# Patient Record
Sex: Female | Born: 1990 | Race: White | Hispanic: No | Marital: Married | Smoking: Never smoker
Health system: Southern US, Community
[De-identification: ages and names within clinical notes are randomized; demographics above are authoritative.]

## PROBLEM LIST (undated history)

## (undated) ENCOUNTER — Inpatient Hospital Stay (HOSPITAL_COMMUNITY): Payer: Self-pay

## (undated) DIAGNOSIS — O139 Gestational [pregnancy-induced] hypertension without significant proteinuria, unspecified trimester: Secondary | ICD-10-CM

## (undated) DIAGNOSIS — F419 Anxiety disorder, unspecified: Secondary | ICD-10-CM

---

## 2015-10-15 ENCOUNTER — Inpatient Hospital Stay (HOSPITAL_COMMUNITY)

## 2015-10-15 ENCOUNTER — Inpatient Hospital Stay (HOSPITAL_COMMUNITY)
Admission: AD | Admit: 2015-10-15 | Discharge: 2015-10-15 | Disposition: A | Discharge status: Home / Self Care | Source: Ambulatory Visit | Attending: Family Medicine | Admitting: Family Medicine

## 2015-10-15 ENCOUNTER — Encounter (HOSPITAL_COMMUNITY): Payer: Self-pay | Admitting: *Deleted

## 2015-10-15 DIAGNOSIS — R109 Unspecified abdominal pain: Secondary | ICD-10-CM | POA: Diagnosis not present

## 2015-10-15 DIAGNOSIS — O9989 Other specified diseases and conditions complicating pregnancy, childbirth and the puerperium: Secondary | ICD-10-CM | POA: Insufficient documentation

## 2015-10-15 DIAGNOSIS — Z3A01 Less than 8 weeks gestation of pregnancy: Secondary | ICD-10-CM | POA: Insufficient documentation

## 2015-10-15 DIAGNOSIS — O26899 Other specified pregnancy related conditions, unspecified trimester: Secondary | ICD-10-CM

## 2015-10-15 HISTORY — DX: Anxiety disorder, unspecified: F41.9

## 2015-10-15 LAB — CBC WITH DIFFERENTIAL/PLATELET
BASOS PCT: 0 %
Basophils Absolute: 0 10*3/uL (ref 0.0–0.1)
EOS ABS: 0.1 10*3/uL (ref 0.0–0.7)
Eosinophils Relative: 1 %
HEMATOCRIT: 38.9 % (ref 36.0–46.0)
HEMOGLOBIN: 13.5 g/dL (ref 12.0–15.0)
LYMPHS ABS: 2.3 10*3/uL (ref 0.7–4.0)
Lymphocytes Relative: 19 %
MCH: 30.5 pg (ref 26.0–34.0)
MCHC: 34.7 g/dL (ref 30.0–36.0)
MCV: 87.8 fL (ref 78.0–100.0)
MONOS PCT: 4 %
Monocytes Absolute: 0.5 10*3/uL (ref 0.1–1.0)
Neutro Abs: 9.3 10*3/uL — ABNORMAL HIGH (ref 1.7–7.7)
Neutrophils Relative %: 76 %
Platelets: 353 10*3/uL (ref 150–400)
RBC: 4.43 MIL/uL (ref 3.87–5.11)
RDW: 14.1 % (ref 11.5–15.5)
WBC: 12.2 10*3/uL — ABNORMAL HIGH (ref 4.0–10.5)

## 2015-10-15 LAB — GLUCOSE, CAPILLARY: GLUCOSE-CAPILLARY: 88 mg/dL (ref 65–99)

## 2015-10-15 LAB — HCG, QUANTITATIVE, PREGNANCY: HCG, BETA CHAIN, QUANT, S: 3883 m[IU]/mL — AB (ref <5)

## 2015-10-15 LAB — POCT PREGNANCY, URINE: Preg Test, Ur: POSITIVE — AB

## 2015-10-15 MED ORDER — ONDANSETRON 8 MG PO TBDP
8.0000 mg | ORAL_TABLET | Freq: Once | ORAL | Status: AC
  Administered 2015-10-15: 8 mg via ORAL
  Filled 2015-10-15: qty 1

## 2015-10-15 NOTE — MAU Note (Signed)
Pt states has been having stabbing pain in lower stomach since 1500 today and felt like she was going to pass out.  LMP 09/10/15.  Denies vaginal bleeding/discharge.

## 2015-10-15 NOTE — MAU Provider Note (Signed)
History     g3P2  @ apx 5 wks in with severe abd pain and cramping that was so severe she almost passed out. This all started this afternoon. Denies vag bleeding.  CSN: [id number]  Arrival date & time 10/15/15  1721   First Provider Initiated Contact with Patient 10/15/15 1738      Chief Complaint  Patient presents with  . Abdominal Pain    HPI  No past medical history on file.  No past surgical history on file.  No family history on file.  Social History  Substance Use Topics  . Smoking status: Not on file  . Smokeless tobacco: Not on file  . Alcohol Use: Not on file    OB History    Gravida Para Term Preterm AB TAB SAB Ectopic Multiple Living   1               Review of Systems  Constitutional: Negative.   HENT: Negative.   Eyes: Negative.   Respiratory: Negative.   Cardiovascular: Negative.   Gastrointestinal: Positive for abdominal pain.  Endocrine: Negative.   Genitourinary: Negative.   Musculoskeletal: Negative.   Skin: Negative.   Allergic/Immunologic: Negative.   Neurological: Negative.   Hematological: Negative.   Psychiatric/Behavioral: Negative.     Allergies  Review of patient's allergies indicates not on file.  Home Medications  No current outpatient prescriptions on file.  BP 106/60 mmHg  Pulse 91  Temp(Src) 99 F (37.2 C) (Oral)  Resp 18  Ht 5\' 2"  (1.575 m)  Wt 109 lb (49.442 kg)  BMI 19.93 kg/m2  LMP 09/10/2015  Physical Exam  Constitutional: She is oriented to person, place, and time. She appears well-developed and well-nourished.  HENT:  Head: Normocephalic.  Eyes: Pupils are equal, round, and reactive to light.  Neck: Normal range of motion.  Cardiovascular: Normal rate, regular rhythm, normal heart sounds and intact distal pulses.   Pulmonary/Chest: Effort normal and breath sounds normal.  Abdominal: Soft. Bowel sounds are normal.  Genitourinary: Vagina normal and uterus normal.  Musculoskeletal: Normal range of motion.   Neurological: She is alert and oriented to person, place, and time. She has normal reflexes.  Skin: Skin is warm and dry.  Psychiatric: She has a normal mood and affect. Her behavior is normal. Judgment and thought content normal.    MAU Course  Procedures (including critical care time)  Labs Reviewed  CBC WITH DIFFERENTIAL/PLATELET  HCG, QUANTITATIVE, PREGNANCY  GLUCOSE, CAPILLARY   No results found.   1. Abdominal pain affecting pregnancy, antepartum       MDM  Cbc, quant hcg, complete us. Normal findings will d/c home

## 2015-10-15 NOTE — Discharge Instructions (Signed)
First Trimester of Pregnancy The first trimester of pregnancy is from week 1 until the end of week 12 (months 1 through 3). A week after a sperm fertilizes an egg, the egg will implant on the wall of the uterus. This embryo will begin to develop into a baby. Genes from you and your partner are forming the baby. The female genes determine whether the baby is a boy or a girl. At 6-8 weeks, the eyes and face are formed, and the heartbeat can be seen on ultrasound. At the end of 12 weeks, all the baby's organs are formed.  Now that you are pregnant, you will want to do everything you can to have a healthy baby. Two of the most important things are to get good prenatal care and to follow your health care provider's instructions. Prenatal care is all the medical care you receive before the baby's birth. This care will help prevent, find, and treat any problems during the pregnancy and childbirth. BODY CHANGES Your body goes through many changes during pregnancy. The changes vary from woman to woman.   You may gain or lose a couple of pounds at first.  You may feel sick to your stomach (nauseous) and throw up (vomit). If the vomiting is uncontrollable, call your health care provider.  You may tire easily.  You may develop headaches that can be relieved by medicines approved by your health care provider.  You may urinate more often. Painful urination may mean you have a bladder infection.  You may develop heartburn as a result of your pregnancy.  You may develop constipation because certain hormones are causing the muscles that push waste through your intestines to slow down.  You may develop hemorrhoids or swollen, bulging veins (varicose veins).  Your breasts may begin to grow larger and become tender. Your nipples may stick out more, and the tissue that surrounds them (areola) may become darker.  Your gums may bleed and may be sensitive to brushing and flossing.  Dark spots or blotches (chloasma,  mask of pregnancy) may develop on your face. This will likely fade after the baby is born.  Your menstrual periods will stop.  You may have a loss of appetite.  You may develop cravings for certain kinds of food.  You may have changes in your emotions from day to day, such as being excited to be pregnant or being concerned that something may go wrong with the pregnancy and baby.  You may have more vivid and strange dreams.  You may have changes in your hair. These can include thickening of your hair, rapid growth, and changes in texture. Some women also have hair loss during or after pregnancy, or hair that feels dry or thin. Your hair will most likely return to normal after your baby is born. WHAT TO EXPECT AT YOUR PRENATAL VISITS During a routine prenatal visit:  You will be weighed to make sure you and the baby are growing normally.  Your blood pressure will be taken.  Your abdomen will be measured to track your baby's growth.  The fetal heartbeat will be listened to starting around week 10 or 12 of your pregnancy.  Test results from any previous visits will be discussed. Your health care provider may ask you:  How you are feeling.  If you are feeling the baby move.  If you have had any abnormal symptoms, such as leaking fluid, bleeding, severe headaches, or abdominal cramping.  If you are using any tobacco products,   including cigarettes, chewing tobacco, and electronic cigarettes.  If you have any questions. Other tests that may be performed during your first trimester include:  Blood tests to find your blood type and to check for the presence of any previous infections. They will also be used to check for low iron levels (anemia) and Rh antibodies. Later in the pregnancy, blood tests for diabetes will be done along with other tests if problems develop.  Urine tests to check for infections, diabetes, or protein in the urine.  An ultrasound to confirm the proper growth  and development of the baby.  An amniocentesis to check for possible genetic problems.  Fetal screens for spina bifida and Down syndrome.  You may need other tests to make sure you and the baby are doing well.  HIV (human immunodeficiency virus) testing. Routine prenatal testing includes screening for HIV, unless you choose not to have this test. HOME CARE INSTRUCTIONS  Medicines  Follow your health care provider's instructions regarding medicine use. Specific medicines may be either safe or unsafe to take during pregnancy.  Take your prenatal vitamins as directed.  If you develop constipation, try taking a stool softener if your health care provider approves. Diet  Eat regular, well-balanced meals. Choose a variety of foods, such as meat or vegetable-based protein, fish, milk and low-fat dairy products, vegetables, fruits, and whole grain breads and cereals. Your health care provider will help you determine the amount of weight gain that is right for you.  Avoid raw meat and uncooked cheese. These carry germs that can cause birth defects in the baby.  Eating four or five small meals rather than three large meals a day may help relieve nausea and vomiting. If you start to feel nauseous, eating a few soda crackers can be helpful. Drinking liquids between meals instead of during meals also seems to help nausea and vomiting.  If you develop constipation, eat more high-fiber foods, such as fresh vegetables or fruit and whole grains. Drink enough fluids to keep your urine clear or pale yellow. Activity and Exercise  Exercise only as directed by your health care provider. Exercising will help you:  Control your weight.  Stay in shape.  Be prepared for labor and delivery.  Experiencing pain or cramping in the lower abdomen or low back is a good sign that you should stop exercising. Check with your health care provider before continuing normal exercises.  Try to avoid standing for long  periods of time. Move your legs often if you must stand in one place for a long time.  Avoid heavy lifting.  Wear low-heeled shoes, and practice good posture.  You may continue to have sex unless your health care provider directs you otherwise. Relief of Pain or Discomfort  Wear a good support bra for breast tenderness.   Take warm sitz baths to soothe any pain or discomfort caused by hemorrhoids. Use hemorrhoid cream if your health care provider approves.   Rest with your legs elevated if you have leg cramps or low back pain.  If you develop varicose veins in your legs, wear support hose. Elevate your feet for 15 minutes, 3-4 times a day. Limit salt in your diet. Prenatal Care  Schedule your prenatal visits by the twelfth week of pregnancy. They are usually scheduled monthly at first, then more often in the last 2 months before delivery.  Write down your questions. Take them to your prenatal visits.  Keep all your prenatal visits as directed by your   health care provider. Safety  Wear your seat belt at all times when driving.  Make a list of emergency phone numbers, including numbers for family, friends, the hospital, and police and fire departments. General Tips  Ask your health care provider for a referral to a local prenatal education class. Begin classes no later than at the beginning of month 6 of your pregnancy.  Ask for help if you have counseling or nutritional needs during pregnancy. Your health care provider can offer advice or refer you to specialists for help with various needs.  Do not use hot tubs, steam rooms, or saunas.  Do not douche or use tampons or scented sanitary pads.  Do not cross your legs for long periods of time.  Avoid cat litter boxes and soil used by cats. These carry germs that can cause birth defects in the baby and possibly loss of the fetus by miscarriage or stillbirth.  Avoid all smoking, herbs, alcohol, and medicines not prescribed by  your health care provider. Chemicals in these affect the formation and growth of the baby.  Do not use any tobacco products, including cigarettes, chewing tobacco, and electronic cigarettes. If you need help quitting, ask your health care provider. You may receive counseling support and other resources to help you quit.  Schedule a dentist appointment. At home, brush your teeth with a soft toothbrush and be gentle when you floss. SEEK MEDICAL CARE IF:   You have dizziness.  You have mild pelvic cramps, pelvic pressure, or nagging pain in the abdominal area.  You have persistent nausea, vomiting, or diarrhea.  You have a bad smelling vaginal discharge.  You have pain with urination.  You notice increased swelling in your face, hands, legs, or ankles. SEEK IMMEDIATE MEDICAL CARE IF:   You have a fever.  You are leaking fluid from your vagina.  You have spotting or bleeding from your vagina.  You have severe abdominal cramping or pain.  You have rapid weight gain or loss.  You vomit blood or material that looks like coffee grounds.  You are exposed to German measles and have never had them.  You are exposed to fifth disease or chickenpox.  You develop a severe headache.  You have shortness of breath.  You have any kind of trauma, such as from a fall or a car accident.   This information is not intended to replace advice given to you by your health care provider. Make sure you discuss any questions you have with your health care provider.   Document Released: 05/20/2001 Document Revised: 06/16/2014 Document Reviewed: 04/05/2013 Elsevier Interactive Patient Education 2016 [employer]  

## 2015-10-15 NOTE — MAU Note (Signed)
Urine in lab 

## 2015-10-26 LAB — OB RESULTS CONSOLE ABO/RH: RH TYPE: POSITIVE

## 2015-10-26 LAB — OB RESULTS CONSOLE RUBELLA ANTIBODY, IGM: RUBELLA: IMMUNE

## 2015-10-26 LAB — OB RESULTS CONSOLE RPR: RPR: NONREACTIVE

## 2015-10-26 LAB — OB RESULTS CONSOLE ANTIBODY SCREEN: Antibody Screen: NEGATIVE

## 2015-10-26 LAB — OB RESULTS CONSOLE HEPATITIS B SURFACE ANTIGEN: HEP B S AG: NEGATIVE

## 2015-10-26 LAB — OB RESULTS CONSOLE HIV ANTIBODY (ROUTINE TESTING): HIV: NONREACTIVE

## 2015-11-22 ENCOUNTER — Encounter (HOSPITAL_COMMUNITY): Payer: Self-pay

## 2015-11-22 ENCOUNTER — Inpatient Hospital Stay (HOSPITAL_COMMUNITY)
Admission: AD | Admit: 2015-11-22 | Discharge: 2015-11-22 | Disposition: A | Discharge status: Home / Self Care | Payer: Medicaid Other | Source: Ambulatory Visit | Attending: Obstetrics and Gynecology | Admitting: Obstetrics and Gynecology

## 2015-11-22 ENCOUNTER — Inpatient Hospital Stay (HOSPITAL_COMMUNITY): Payer: Medicaid Other

## 2015-11-22 DIAGNOSIS — O26899 Other specified pregnancy related conditions, unspecified trimester: Secondary | ICD-10-CM

## 2015-11-22 DIAGNOSIS — R109 Unspecified abdominal pain: Secondary | ICD-10-CM | POA: Insufficient documentation

## 2015-11-22 DIAGNOSIS — Z3A10 10 weeks gestation of pregnancy: Secondary | ICD-10-CM | POA: Diagnosis not present

## 2015-11-22 DIAGNOSIS — O219 Vomiting of pregnancy, unspecified: Secondary | ICD-10-CM | POA: Diagnosis not present

## 2015-11-22 DIAGNOSIS — O210 Mild hyperemesis gravidarum: Secondary | ICD-10-CM | POA: Insufficient documentation

## 2015-11-22 DIAGNOSIS — IMO0002 Reserved for concepts with insufficient information to code with codable children: Secondary | ICD-10-CM

## 2015-11-22 LAB — CBC
HCT: 38.1 % (ref 36.0–46.0)
HEMOGLOBIN: 13.1 g/dL (ref 12.0–15.0)
MCH: 29.4 pg (ref 26.0–34.0)
MCHC: 34.4 g/dL (ref 30.0–36.0)
MCV: 85.6 fL (ref 78.0–100.0)
Platelets: 301 10*3/uL (ref 150–400)
RBC: 4.45 MIL/uL (ref 3.87–5.11)
RDW: 13.6 % (ref 11.5–15.5)
WBC: 8.8 10*3/uL (ref 4.0–10.5)

## 2015-11-22 LAB — COMPREHENSIVE METABOLIC PANEL
ALBUMIN: 3.7 g/dL (ref 3.5–5.0)
ALK PHOS: 35 U/L — AB (ref 38–126)
ALT: 12 U/L — AB (ref 14–54)
AST: 16 U/L (ref 15–41)
Anion gap: 5 (ref 5–15)
BILIRUBIN TOTAL: 0.8 mg/dL (ref 0.3–1.2)
BUN: 6 mg/dL (ref 6–20)
CALCIUM: 8.8 mg/dL — AB (ref 8.9–10.3)
CO2: 24 mmol/L (ref 22–32)
CREATININE: 0.4 mg/dL — AB (ref 0.44–1.00)
Chloride: 104 mmol/L (ref 101–111)
GFR calc Af Amer: >60 mL/min (ref >60)
GFR calc non Af Amer: >60 mL/min (ref >60)
GLUCOSE: 82 mg/dL (ref 65–99)
POTASSIUM: 3.6 mmol/L (ref 3.5–5.1)
SODIUM: 133 mmol/L — AB (ref 135–145)
TOTAL PROTEIN: 6.5 g/dL (ref 6.5–8.1)

## 2015-11-22 LAB — URINALYSIS, ROUTINE W REFLEX MICROSCOPIC
Bilirubin Urine: NEGATIVE
Glucose, UA: NEGATIVE mg/dL
Hgb urine dipstick: NEGATIVE
KETONES UR: NEGATIVE mg/dL
LEUKOCYTES UA: NEGATIVE
NITRITE: NEGATIVE
PH: 6 (ref 5.0–8.0)
PROTEIN: NEGATIVE mg/dL
Specific Gravity, Urine: <1.005 — ABNORMAL LOW (ref 1.005–1.030)

## 2015-11-22 MED ORDER — SODIUM CHLORIDE 0.9 % IV SOLN
8.0000 mg | Freq: Once | INTRAVENOUS | Status: AC
  Administered 2015-11-22: 8 mg via INTRAVENOUS
  Filled 2015-11-22: qty 4

## 2015-11-22 MED ORDER — LACTATED RINGERS IV BOLUS (SEPSIS)
1000.0000 mL | Freq: Once | INTRAVENOUS | Status: AC
  Administered 2015-11-22: 1000 mL via INTRAVENOUS

## 2015-11-22 MED ORDER — PROMETHAZINE HCL 25 MG/ML IJ SOLN
25.0000 mg | Freq: Once | INTRAMUSCULAR | Status: AC
  Administered 2015-11-22: 25 mg via INTRAVENOUS
  Filled 2015-11-22: qty 1

## 2015-11-22 MED ORDER — ONDANSETRON 4 MG PO TBDP: 4.0000 mg | ORAL_TABLET | Freq: Three times a day (TID) | ORAL | Status: DC | PRN

## 2015-11-22 MED ORDER — SIMETHICONE 80 MG PO CHEW
160.0000 mg | CHEWABLE_TABLET | Freq: Once | ORAL | Status: AC
  Administered 2015-11-22: 160 mg via ORAL
  Filled 2015-11-22 (×2): qty 2

## 2015-11-22 NOTE — MAU Note (Signed)
Pt C/O nausea & vomiting since 1400 yesterday, unable to hold anything down since then.  Taking nausea med, is not helping.  Temp has been 101, 102 @ home.  Also has lower abd cramping, has cyst on R ovary.  Pain started on Tuesday, has become more intense.  Denies bleeding.

## 2015-11-22 NOTE — MAU Provider Note (Signed)
History     CSN: [id number]  Arrival date and time: 11/22/15 1210   First Provider Initiated Contact with Patient 11/22/15 1307      Chief Complaint  Patient presents with  . Emesis During Pregnancy  . Abdominal Pain   HPI  Vanessa Jenkins is a 25 y.o. G3P1102 at [redacted]w[redacted]d who presents with n/v, fever, & abdominal pain. Reports n/v throughout pregnancy that she is taking Diclegis QID for. Symptoms worsened yesterday at 2 pm. Reports that she has been unable to keep down food/fluids since then & has vomited 15+ times. Reports fever at home this morning that was 102; she took tylenol around 11 am for the fever. Lower abdominal cramping for several weeks; worse today. Rates pain 5/10. Thinks pain is d/t right ovarian cyst she was told she had. No treatment for pain.  Denies diarrhea, constipation, dysuria, heartburn, or sick contacts. Denies cough or sore throat.   OB History    Gravida Para Term Preterm AB TAB SAB Ectopic Multiple Living   Past Medical History  Diagnosis Date  . Anxiety     Past Surgical History  Procedure Laterality Date  . Cesarean section      Family History  Problem Relation Age of Onset  . Diabetes Mother   . Diabetes Father   . Diabetes Maternal Grandmother   . Diabetes Maternal Grandfather   . Diabetes Paternal Grandmother   . Diabetes Paternal Grandfather     Social History  Substance Use Topics  . Smoking status: Never Smoker   . Smokeless tobacco: Never Used  . Alcohol Use: No    Allergies:  Allergies  Allergen Reactions  . Sulfa Antibiotics Swelling and Rash  . Shellfish Allergy Swelling and Rash    Prescriptions prior to admission  Medication Sig Dispense Refill Last Dose  . Prenatal Vit-Fe Fumarate-FA (PRENATAL MULTIVITAMIN) TABS tablet Take 1 tablet by mouth daily at 12 noon.   10/15/2015 at Unknown time    Review of Systems  Constitutional: Positive for fever. Negative for chills.  HENT: Negative for sore throat.    Respiratory: Negative for cough.   Gastrointestinal: Positive for nausea, vomiting and abdominal pain. Negative for heartburn, diarrhea and constipation.  Genitourinary: Negative.   Musculoskeletal: Negative for myalgias.  Neurological: Negative for headaches.   Physical Exam   Blood pressure 97/46, pulse 75, temperature 97.8 F (36.6 C), temperature source Oral, resp. rate 17, height  (1.549 m), weight 120 lb (54.432 kg), last menstrual period 09/10/2015.  Physical Exam  Nursing note and vitals reviewed. Constitutional: She is oriented to person, place, and time. She appears well-developed and well-nourished. No distress.  HENT:  Head: Normocephalic and atraumatic.  Eyes: Conjunctivae are normal. Right eye exhibits no discharge. Left eye exhibits no discharge. No scleral icterus.  Neck: Normal range of motion.  Cardiovascular: Normal rate, regular rhythm and normal heart sounds.   No murmur heard. Respiratory: Effort normal and breath sounds normal. No respiratory distress. She has no wheezes.  GI: Soft. Bowel sounds are normal. She exhibits no distension. There is no tenderness. There is no rebound and no guarding.  Neurological: She is alert and oriented to person, place, and time.  Skin: Skin is warm and dry. She is not diaphoretic.  Psychiatric: She has a normal mood and affect. Her behavior is normal. Judgment and thought content normal.   Dilation: Closed Effacement (%): Thick Exam  by:: Claudell Rhody,np  MAU Course  Procedures Results for orders placed or performed during the hospital encounter of 11/22/15 (from the past 24 hour(s))  Urinalysis, Routine w reflex microscopic (not at [facility])     Status: Abnormal   Collection Time: 11/22/15 12:35 PM  Result Value Ref Range   Color, Urine YELLOW YELLOW   APPearance CLEAR CLEAR   Specific Gravity, Urine <1.005 (L) 1.005 - 1.030   pH 6.0 5.0 - 8.0   Glucose, UA NEGATIVE NEGATIVE mg/dL   Hgb urine dipstick NEGATIVE  NEGATIVE   Bilirubin Urine NEGATIVE NEGATIVE   Ketones, ur NEGATIVE NEGATIVE mg/dL   Protein, ur NEGATIVE NEGATIVE mg/dL   Nitrite NEGATIVE NEGATIVE   Leukocytes, UA NEGATIVE NEGATIVE  CBC     Status: None   Collection Time: 11/22/15  1:07 PM  Result Value Ref Range   WBC 8.8 4.0 - 10.5 K/uL   RBC 4.45 3.87 - 5.11 MIL/uL   Hemoglobin 13.1 12.0 - 15.0 g/dL   HCT 16.1 09.6 - 04.5 %   MCV 85.6 78.0 - 100.0 fL   MCH 29.4 26.0 - 34.0 pg   MCHC 34.4 30.0 - 36.0 g/dL   RDW 40.9 81.1 - 91.4 %   Platelets 301 150 - 400 K/uL  Comprehensive metabolic panel     Status: Abnormal   Collection Time: 11/22/15  1:07 PM  Result Value Ref Range   Sodium 133 (L) 135 - 145 mmol/L   Potassium 3.6 3.5 - 5.1 mmol/L   Chloride 104 101 - 111 mmol/L   CO2 24 22 - 32 mmol/L   Glucose, Bld 82 65 - 99 mg/dL   BUN 6 6 - 20 mg/dL   Creatinine, Ser 7.82 (L) 0.44 - 1.00 mg/dL   Calcium 8.8 (L) 8.9 - 10.3 mg/dL   Total Protein 6.5 6.5 - 8.1 g/dL   Albumin 3.7 3.5 - 5.0 g/dL   AST 16 15 - 41 U/L   ALT 12 (L) 14 - 54 U/L   Alkaline Phosphatase 35 (L) 38 - 126 U/L   Total Bilirubin 0.8 0.3 - 1.2 mg/dL   GFR calc non Af Amer >60 >60 mL/min   GFR calc Af Amer >60 >60 mL/min   Anion gap 5 5 - 15   US Ob Transvaginal  11/22/2015  CLINICAL DATA:  Ten weeks and 3 days pregnant. No heart tones with Doppler. EXAM: TRANSVAGINAL OB ULTRASOUND TECHNIQUE: Transvaginal ultrasound was performed for complete evaluation of the gestation as well as the maternal uterus, adnexal regions, and pelvic cul-de-sac. COMPARISON:  10/15/2015. FINDINGS: Intrauterine gestational sac: Visualized Yolk sac:  Visualized Embryo:  Visualized Cardiac Activity: Visualized Heart Rate: 171 bpm CRL:   37.2  mm   10 w 4 d                  [place] EDC: 06/15/2016 Subchorionic hemorrhage:  None visualized. Maternal uterus/adnexae: Right ovarian corpus luteum cyst. The left ovary was not visualized. No free peritoneal fluid. IMPRESSION: 1. Single live  intrauterine gestation with an estimated gestational age of [redacted] weeks and 4 days. 2. Nonvisualized left ovary. Electronically Signed   By: Beckie Salts M.D.   On: 11/22/2015 15:37    MDM  Unable to doppler FHT -- ultrasound ordered Urinalysis - no ketones & normal specific gravity CBC normal Pt afebrile IV LR bolus with phenergan 25 mg  Pt not observed vomiting in MAU & able to tolerate crackers & ginger ale Ultrasound shows SIUP  with cardiac activity -- view of left ovary obscured by gas; normal right ovary Simethicone 160 mg PO Pt called out requesting more medication for nausea -- zofran 8 mg IV ordered S/w Dr. Richardson Doppole -- ok to discharge home & rx antiemetic  Assessment and Plan  A: 1. Nausea and vomiting during pregnancy prior to [redacted] weeks gestation   2. Fetal heart tones not heard   3. [redacted] weeks gestation of pregnancy   4. Abdominal pain affecting pregnancy     P: Discharge home Rx zofran 4 mg odt Continue Diclegis as prescribed Small frequent meals throughout the day Discussed reasons to return to MAU Keep f/u with OB  Judeth HornErin Neri Samek 11/22/2015, 1:07 PM

## 2015-11-22 NOTE — Discharge Instructions (Signed)
Eating Plan for Hyperemesis Gravidarum °Severe cases of hyperemesis gravidarum can lead to dehydration and malnutrition. The hyperemesis eating plan is one way to lessen the symptoms of nausea and vomiting. It is often used with prescribed medicines to control your symptoms.  °WHAT CAN I DO TO RELIEVE MY SYMPTOMS? °Listen to your body. Everyone is different and has different preferences. Find what works best for you. Some of the following things may help: °· Eat and drink slowly. °· Eat 5-6 small meals daily instead of 3 large meals.   °· Eat crackers before you get out of bed in the morning.   °· Starchy foods are usually well tolerated (such as cereal, toast, bread, potatoes, pasta, rice, and pretzels).   °· Ginger may help with nausea. Add ¼ tsp ground ginger to hot tea or choose ginger tea.   °· Try drinking 100% fruit juice or an electrolyte drink. °· Continue to take your prenatal vitamins as directed by your health care provider. If you are having trouble taking your prenatal vitamins, talk with your health care provider about different options. °· Include at least 1 serving of protein with your meals and snacks (such as meats or poultry, beans, nuts, eggs, or yogurt). Try eating a protein-rich snack before bed (such as cheese and crackers or a half turkey or peanut butter sandwich). °WHAT THINGS SHOULD I AVOID TO REDUCE MY SYMPTOMS? °The following things may help reduce your symptoms: °· Avoid foods with strong smells. Try eating meals in well-ventilated areas that are free of odors. °· Avoid drinking water or other beverages with meals. Try not to drink anything less than 30 minutes before and after meals. °· Avoid drinking more than 1 cup of fluid at a time. °· Avoid fried or high-fat foods, such as butter and cream sauces. °· Avoid spicy foods. °· Avoid skipping meals the best you can. Nausea can be more intense on an empty stomach. If you cannot tolerate food at that time, do not force it. Try sucking on  ice chips or other frozen items and make up the calories later. °· Avoid lying down within 2 hours after eating. °  °This information is not intended to replace advice given to you by your health care provider. Make sure you discuss any questions you have with your health care provider. °  °Document Released: 03/23/2007 Document Revised: 05/31/2013 Document Reviewed: 03/30/2013 °Elsevier Interactive Patient Education ©2016 [employer]. ° °

## 2015-11-28 ENCOUNTER — Other Ambulatory Visit: Payer: Self-pay | Admitting: Obstetrics and Gynecology

## 2015-11-28 ENCOUNTER — Other Ambulatory Visit (HOSPITAL_COMMUNITY)
Admission: RE | Admit: 2015-11-28 | Discharge: 2015-11-28 | Disposition: A | Discharge status: Home / Self Care | Source: Ambulatory Visit | Attending: Obstetrics and Gynecology | Admitting: Obstetrics and Gynecology

## 2015-11-28 DIAGNOSIS — Z01419 Encounter for gynecological examination (general) (routine) without abnormal findings: Secondary | ICD-10-CM | POA: Diagnosis not present

## 2015-11-28 DIAGNOSIS — Z113 Encounter for screening for infections with a predominantly sexual mode of transmission: Secondary | ICD-10-CM | POA: Insufficient documentation

## 2015-12-03 LAB — CYTOLOGY - PAP

## 2016-02-11 ENCOUNTER — Inpatient Hospital Stay (HOSPITAL_COMMUNITY)
Admission: AD | Admit: 2016-02-11 | Discharge: 2016-02-11 | Disposition: A | Discharge status: Home / Self Care | Payer: Medicaid Other | Source: Ambulatory Visit | Attending: Obstetrics & Gynecology | Admitting: Obstetrics & Gynecology

## 2016-02-11 ENCOUNTER — Inpatient Hospital Stay (HOSPITAL_COMMUNITY): Payer: Medicaid Other

## 2016-02-11 ENCOUNTER — Encounter (HOSPITAL_COMMUNITY): Payer: Self-pay | Admitting: *Deleted

## 2016-02-11 DIAGNOSIS — R109 Unspecified abdominal pain: Secondary | ICD-10-CM | POA: Diagnosis present

## 2016-02-11 DIAGNOSIS — Z3A20 20 weeks gestation of pregnancy: Secondary | ICD-10-CM

## 2016-02-11 DIAGNOSIS — R102 Pelvic and perineal pain: Secondary | ICD-10-CM

## 2016-02-11 DIAGNOSIS — O26892 Other specified pregnancy related conditions, second trimester: Secondary | ICD-10-CM | POA: Diagnosis not present

## 2016-02-11 DIAGNOSIS — O9989 Other specified diseases and conditions complicating pregnancy, childbirth and the puerperium: Secondary | ICD-10-CM | POA: Diagnosis not present

## 2016-02-11 DIAGNOSIS — O219 Vomiting of pregnancy, unspecified: Secondary | ICD-10-CM | POA: Insufficient documentation

## 2016-02-11 DIAGNOSIS — Z3A22 22 weeks gestation of pregnancy: Secondary | ICD-10-CM | POA: Diagnosis not present

## 2016-02-11 DIAGNOSIS — O26899 Other specified pregnancy related conditions, unspecified trimester: Secondary | ICD-10-CM

## 2016-02-11 LAB — URINALYSIS, ROUTINE W REFLEX MICROSCOPIC
Bilirubin Urine: NEGATIVE
Glucose, UA: NEGATIVE mg/dL
Hgb urine dipstick: NEGATIVE
Ketones, ur: NEGATIVE mg/dL
LEUKOCYTES UA: NEGATIVE
NITRITE: NEGATIVE
Protein, ur: NEGATIVE mg/dL
pH: 6 (ref 5.0–8.0)

## 2016-02-11 MED ORDER — PROMETHAZINE HCL 25 MG RE SUPP
25.0000 mg | Freq: Four times a day (QID) | RECTAL | 0 refills | Status: DC | PRN
Start: 2016-02-11 — End: 2016-02-11

## 2016-02-11 MED ORDER — PROMETHAZINE HCL 25 MG RE SUPP: 25.0000 mg | Freq: Four times a day (QID) | RECTAL | 0 refills | Status: DC | PRN

## 2016-02-11 MED ORDER — PROMETHAZINE HCL 25 MG PO TABS
12.5000 mg | ORAL_TABLET | Freq: Once | ORAL | Status: AC
  Administered 2016-02-11: 12.5 mg via ORAL
  Filled 2016-02-11: qty 1

## 2016-02-11 MED ORDER — ONDANSETRON 4 MG PO TBDP: 4.0000 mg | ORAL_TABLET | Freq: Once | ORAL | Status: DC

## 2016-02-11 NOTE — Discharge Instructions (Signed)

## 2016-02-11 NOTE — MAU Provider Note (Signed)
History   [id number]   Chief Complaint  Patient presents with  . Abdominal Pain  . Emesis    HPI Vanessa Jenkins is a 25 y.o. female  G3P1102 at [redacted]w[redacted]d IUP here with report of abdominal cramping that woke her up this morning.  Pain is associated with nausea and vomiting.  Reports able to drink water.  Denies vaginal bleeding or abnormal vaginal discharge.  No constipation, fever, body aches, or chills.     Patient's last menstrual period was 09/10/2015.  OB History  Gravida Para Term Preterm AB Living  3 2 1 1   2   SAB TAB Ectopic Multiple Live Births               # Outcome Date GA Lbr Len/2nd Weight Sex Delivery Anes PTL Lv  3 Current           2 Term  [redacted]w[redacted]d    CS-LTranv     1 Preterm  [redacted]w[redacted]d    CS-Unspec         Past Medical History:  Diagnosis Date  . Anxiety     Family History  Problem Relation Age of Onset  . Diabetes Mother   . Diabetes Father   . Diabetes Maternal Grandmother   . Diabetes Maternal Grandfather   . Diabetes Paternal Grandmother   . Diabetes Paternal Grandfather     Social History   Social History  . Marital status: Married    Spouse name: N/A  . Number of children: N/A  . Years of education: N/A   Social History Main Topics  . Smoking status: Never Smoker  . Smokeless tobacco: Never Used  . Alcohol use No  . Drug use: No  . Sexual activity: Yes    Birth control/ protection: None   Other Topics Concern  . None   Social History Narrative  . None    Allergies  Allergen Reactions  . Sulfa Antibiotics Swelling and Rash  . Shellfish Allergy Swelling and Rash  . Sodium Citrate Rash    No current facility-administered medications on file prior to encounter.    Current Outpatient Prescriptions on File Prior to Encounter  Medication Sig Dispense Refill  . acetaminophen (TYLENOL) 325 MG tablet Take 650 mg by mouth every 6 (six) hours as needed for moderate pain or fever.    . ondansetron (ZOFRAN ODT) 4 MG disintegrating tablet Take 1  tablet (4 mg total) by mouth every 8 (eight) hours as needed for nausea or vomiting. 15 tablet 0  . Prenatal Vit-Fe Fumarate-FA (PRENATAL MULTIVITAMIN) TABS tablet Take 1 tablet by mouth daily at 12 noon.       Review of Systems  Constitutional: Negative for chills and fever.  Gastrointestinal: Positive for abdominal pain, nausea and vomiting. Negative for constipation and diarrhea.  Genitourinary: Negative for dysuria, flank pain, pelvic pain, vaginal bleeding and vaginal discharge.     Physical Exam   Vitals:   02/11/16 0516 02/11/16 0519  BP:  108/62  Pulse:  76  Resp:  16  Temp:  97.9 F (36.6 C)  TempSrc:  Oral  SpO2:  99%  Weight: 129 lb 1.3 oz (58.6 kg)     Physical Exam  Constitutional: She is oriented to person, place, and time. She appears well-developed and well-nourished.  HENT:  Head: Normocephalic.  Neck: Normal range of motion. Neck supple.  Cardiovascular: Normal rate, regular rhythm and normal heart sounds.   Respiratory: Effort normal and breath sounds normal. No respiratory distress.  GI: Soft. She exhibits no mass. There is tenderness (left upper side). There is no rigidity, no rebound and no guarding.    Genitourinary: No bleeding in the vagina. Vaginal discharge (mucusy) found.  Musculoskeletal: Normal range of motion. She exhibits no edema.  Neurological: She is alert and oriented to person, place, and time.  Skin: Skin is warm and dry.    MAU Course  Procedures  MDM Results for orders placed or performed during the hospital encounter of 02/11/16 (from the past 24 hour(s))  Urinalysis, Routine w reflex microscopic ([facility])     Status: Abnormal   Collection Time: 02/11/16  5:07 AM  Result Value Ref Range   Color, Urine YELLOW YELLOW   APPearance CLEAR CLEAR   Specific Gravity, Urine <1.005 (L) 1.005 - 1.030   pH 6.0 5.0 - 8.0   Glucose, UA NEGATIVE NEGATIVE mg/dL   Hgb urine dipstick NEGATIVE NEGATIVE   Bilirubin Urine NEGATIVE  NEGATIVE   Ketones, ur NEGATIVE NEGATIVE mg/dL   Protein, ur NEGATIVE NEGATIVE mg/dL   Nitrite NEGATIVE NEGATIVE   Leukocytes, UA NEGATIVE NEGATIVE    Ultrasound: Cervical length 4.18  0705 Consulted with Dr. Charlotta Newtonzan > .Reviewed HPI/Exam/labs/ultrasound > offer Tylenol and discharge home; keep scheduled follow-up  0708 Pt declines Tylenol; able to hold down phenergan 12.5 mg PO given earlier  Assessment and Plan  25 y.o. [id number] at 4913w0d IUP  Abdominal Pain in Pregnancy - Normal Exam Nausea and Vomiting  Plan: Discharge home RX Phenergan suppository Follow-up if worsening of symptoms Keep scheduled appointment for Wednesday  Marlis EdelsonWalidah N Karim, CNM 02/11/2016 7:36 AM

## 2016-02-11 NOTE — MAU Note (Signed)
Patient presents to mau with c/o abdominal cramping that awoke her this am and has since then had episodes of nausea and vomiting.

## 2016-04-14 ENCOUNTER — Inpatient Hospital Stay (HOSPITAL_COMMUNITY)
Admission: AD | Admit: 2016-04-14 | Discharge: 2016-04-16 | DRG: 781 | Disposition: A | Discharge status: Home / Self Care | Payer: Medicaid Other | Source: Ambulatory Visit | Attending: Obstetrics and Gynecology | Admitting: Obstetrics and Gynecology

## 2016-04-14 ENCOUNTER — Encounter (HOSPITAL_COMMUNITY): Payer: Self-pay | Admitting: *Deleted

## 2016-04-14 ENCOUNTER — Other Ambulatory Visit: Payer: Self-pay | Admitting: Obstetrics and Gynecology

## 2016-04-14 DIAGNOSIS — R1011 Right upper quadrant pain: Secondary | ICD-10-CM | POA: Diagnosis present

## 2016-04-14 DIAGNOSIS — O1490 Unspecified pre-eclampsia, unspecified trimester: Secondary | ICD-10-CM

## 2016-04-14 DIAGNOSIS — Z3A31 31 weeks gestation of pregnancy: Secondary | ICD-10-CM

## 2016-04-14 DIAGNOSIS — Z882 Allergy status to sulfonamides status: Secondary | ICD-10-CM | POA: Diagnosis not present

## 2016-04-14 DIAGNOSIS — Z91013 Allergy to seafood: Secondary | ICD-10-CM

## 2016-04-14 DIAGNOSIS — O133 Gestational [pregnancy-induced] hypertension without significant proteinuria, third trimester: Secondary | ICD-10-CM | POA: Diagnosis present

## 2016-04-14 DIAGNOSIS — O26893 Other specified pregnancy related conditions, third trimester: Principal | ICD-10-CM | POA: Diagnosis present

## 2016-04-14 DIAGNOSIS — O163 Unspecified maternal hypertension, third trimester: Secondary | ICD-10-CM | POA: Diagnosis present

## 2016-04-14 DIAGNOSIS — Z98891 History of uterine scar from previous surgery: Secondary | ICD-10-CM | POA: Diagnosis not present

## 2016-04-14 DIAGNOSIS — O139 Gestational [pregnancy-induced] hypertension without significant proteinuria, unspecified trimester: Secondary | ICD-10-CM | POA: Diagnosis present

## 2016-04-14 HISTORY — DX: Gestational (pregnancy-induced) hypertension without significant proteinuria, unspecified trimester: O13.9

## 2016-04-14 LAB — COMPREHENSIVE METABOLIC PANEL
ALK PHOS: 79 U/L (ref 38–126)
ALT: 12 U/L — ABNORMAL LOW (ref 14–54)
AST: 17 U/L (ref 15–41)
Albumin: 3.2 g/dL — ABNORMAL LOW (ref 3.5–5.0)
Anion gap: 8 (ref 5–15)
BILIRUBIN TOTAL: 0.4 mg/dL (ref 0.3–1.2)
CALCIUM: 9 mg/dL (ref 8.9–10.3)
CO2: 22 mmol/L (ref 22–32)
Chloride: 107 mmol/L (ref 101–111)
Creatinine, Ser: 0.44 mg/dL (ref 0.44–1.00)
GFR calc Af Amer: >60 mL/min (ref >60)
GLUCOSE: 110 mg/dL — AB (ref 65–99)
POTASSIUM: 3.5 mmol/L (ref 3.5–5.1)
Sodium: 137 mmol/L (ref 135–145)
TOTAL PROTEIN: 6.6 g/dL (ref 6.5–8.1)

## 2016-04-14 LAB — CBC
HEMATOCRIT: 36.5 % (ref 36.0–46.0)
HEMOGLOBIN: 12.5 g/dL (ref 12.0–15.0)
MCH: 29.3 pg (ref 26.0–34.0)
MCHC: 34.2 g/dL (ref 30.0–36.0)
MCV: 85.7 fL (ref 78.0–100.0)
Platelets: 305 10*3/uL (ref 150–400)
RBC: 4.26 MIL/uL (ref 3.87–5.11)
RDW: 13.8 % (ref 11.5–15.5)
WBC: 12.5 10*3/uL — ABNORMAL HIGH (ref 4.0–10.5)

## 2016-04-14 LAB — PROTEIN / CREATININE RATIO, URINE
Creatinine, Urine: 44 mg/dL
Total Protein, Urine: <6 mg/dL

## 2016-04-14 LAB — TYPE AND SCREEN
ABO/RH(D): A POS
Antibody Screen: NEGATIVE

## 2016-04-14 LAB — ABO/RH: ABO/RH(D): A POS

## 2016-04-14 LAB — LACTATE DEHYDROGENASE: LDH: 134 U/L (ref 98–192)

## 2016-04-14 LAB — URIC ACID: URIC ACID, SERUM: 3.3 mg/dL (ref 2.3–6.6)

## 2016-04-14 MED ORDER — LABETALOL HCL 5 MG/ML IV SOLN: 20.0000 mg | INTRAVENOUS | Status: DC | PRN

## 2016-04-14 MED ORDER — OXYCODONE HCL 5 MG PO TABS
10.0000 mg | ORAL_TABLET | Freq: Four times a day (QID) | ORAL | Status: DC | PRN
  Administered 2016-04-14 – 2016-04-16 (×3): 10 mg via ORAL
  Filled 2016-04-14 (×3): qty 2

## 2016-04-14 MED ORDER — BUTALBITAL-APAP-CAFFEINE 50-325-40 MG PO TABS
1.0000 | ORAL_TABLET | ORAL | Status: DC | PRN
  Administered 2016-04-14 – 2016-04-16 (×3): 1 via ORAL
  Filled 2016-04-14 (×3): qty 1

## 2016-04-14 MED ORDER — PRENATAL MULTIVITAMIN CH
1.0000 | ORAL_TABLET | Freq: Every day | ORAL | Status: DC
  Administered 2016-04-15 – 2016-04-16 (×2): 1 via ORAL
  Filled 2016-04-14 (×2): qty 1

## 2016-04-14 MED ORDER — LACTATED RINGERS IV SOLN
INTRAVENOUS | Status: DC
  Administered 2016-04-16: 13:00:00 via INTRAVENOUS

## 2016-04-14 MED ORDER — CALCIUM CARBONATE ANTACID 500 MG PO CHEW: 2.0000 | CHEWABLE_TABLET | ORAL | Status: DC | PRN

## 2016-04-14 MED ORDER — BETAMETHASONE SOD PHOS & ACET 6 (3-3) MG/ML IJ SUSP
12.0000 mg | Freq: Once | INTRAMUSCULAR | Status: AC
  Administered 2016-04-15: 12 mg via INTRAMUSCULAR
  Filled 2016-04-14: qty 2

## 2016-04-14 MED ORDER — HYDRALAZINE HCL 20 MG/ML IJ SOLN: 10.0000 mg | Freq: Once | INTRAMUSCULAR | Status: DC | PRN

## 2016-04-14 MED ORDER — ZOLPIDEM TARTRATE 5 MG PO TABS
5.0000 mg | ORAL_TABLET | Freq: Every evening | ORAL | Status: DC | PRN
  Administered 2016-04-15: 5 mg via ORAL
  Filled 2016-04-14 (×3): qty 1

## 2016-04-14 MED ORDER — OXYCODONE HCL 5 MG PO TABS
5.0000 mg | ORAL_TABLET | Freq: Four times a day (QID) | ORAL | Status: DC | PRN
  Administered 2016-04-15 (×2): 5 mg via ORAL
  Filled 2016-04-14 (×2): qty 1

## 2016-04-14 MED ORDER — BETAMETHASONE SOD PHOS & ACET 6 (3-3) MG/ML IJ SUSP
12.0000 mg | INTRAMUSCULAR | Status: DC
  Administered 2016-04-14: 12 mg via INTRAMUSCULAR
  Filled 2016-04-14: qty 2

## 2016-04-14 MED ORDER — DOCUSATE SODIUM 100 MG PO CAPS
100.0000 mg | ORAL_CAPSULE | Freq: Every day | ORAL | Status: DC
  Administered 2016-04-16: 100 mg via ORAL
  Filled 2016-04-14: qty 1

## 2016-04-14 MED ORDER — ACETAMINOPHEN 325 MG PO TABS
650.0000 mg | ORAL_TABLET | ORAL | Status: DC | PRN
  Filled 2016-04-14: qty 2

## 2016-04-14 NOTE — H&P (Signed)
Vanessa Jenkins is a 25 y.o. female  At 31 wks and 0 days EGA who presented to the office today complaining of RUQ pain for the last 3 days . She described it as a " stabbing" pain . She tried tylenol without relief. Her BP in the office was 135/114 repeat was 135 /100. Pt reports intermittent headache. She has a h/o preeclampsia with a  Previous pregnancy and reports " this is how it started". She has anxiety as well.    OB History    Gravida Para Term Preterm AB Living   3 2 1 1   2    SAB TAB Ectopic Multiple Live Births                 Past Medical History:  Diagnosis Date  . Anxiety   . Pregnancy induced hypertension   . Preterm labor    Past Surgical History:  Procedure Laterality Date  . CESAREAN SECTION     Family History: family history includes Diabetes in her father, maternal grandfather, maternal grandmother, mother, paternal grandfather, and paternal grandmother. Social History:  reports that she has never smoked. She has never used smokeless tobacco. She reports that she does not drink alcohol or use drugs.     Review of Systems  Constitutional: Negative.   HENT: Negative.   Eyes: Negative for blurred vision, double vision and photophobia.  Respiratory: Negative.   Cardiovascular: Negative.   Gastrointestinal: Positive for abdominal pain and nausea. Negative for vomiting.  Genitourinary: Negative.   Musculoskeletal: Negative.   Skin: Negative.   Neurological: Positive for headaches.  Endo/Heme/Allergies: Negative.   Psychiatric/Behavioral: The patient is nervous/anxious.    Maternal Medical History:  Reason for admission: Nausea.   Fetal activity: Perceived fetal activity is normal.    Prenatal Complications - Diabetes: none.      Blood pressure 108/72, pulse 89, temperature 98.2 F (36.8 C), temperature source Oral, resp. rate 18, height 5\' 1"  (1.549 m), weight 63 kg (139 lb), last menstrual period 09/10/2015. Exam Physical Exam  Vitals  reviewed. Constitutional: She is oriented to person, place, and time. She appears well-developed and well-nourished.  HENT:  Head: Normocephalic and atraumatic.  Eyes: Conjunctivae are normal. Pupils are equal, round, and reactive to light.  Neck: Normal range of motion. Neck supple.  Cardiovascular: Normal rate and regular rhythm.   Respiratory: Effort normal and breath sounds normal.  GI: There is tenderness. There is no rebound and no guarding.  Right upper quadrant tenderness   Musculoskeletal: Normal range of motion. She exhibits no edema.  Neurological: She is alert and oriented to person, place, and time. She has normal reflexes.  Skin: Skin is warm and dry.  Psychiatric: She has a normal mood and affect.    Prenatal labs: ABO, Rh:  A positive  Antibody:  Negative  Rubella:  Immune  RPR:   Nonreactive  HBsAg:   Negative  HIV:   Negative  GBS:     Assessment/Plan: 1) 31 wks and 0 days with elevated bp in the office /RUQ pain /Headache   [facility] to rule out [facility]. PIH labs /Serial BP's/ / MFM consult in AM if bp is elevated during admission.  Bethamethasone for FLM 2) H/O preterm delivery due to preeclampsia and preterm labor- Makena weekly -next dose due 04/16/2016 3)  H/o Cesarean Section x 2 - planned repeat cesarean section at 39 wks sooner if Preeclampsia develops.    Donis Pinder J. 04/14/2016, 5:24  PM

## 2016-04-15 ENCOUNTER — Inpatient Hospital Stay (HOSPITAL_COMMUNITY): Payer: Medicaid Other

## 2016-04-15 MED ORDER — PANTOPRAZOLE SODIUM 40 MG PO TBEC
40.0000 mg | DELAYED_RELEASE_TABLET | Freq: Every day | ORAL | Status: DC
  Administered 2016-04-15 – 2016-04-16 (×2): 40 mg via ORAL
  Filled 2016-04-15 (×2): qty 1

## 2016-04-15 MED ORDER — LACTATED RINGERS IV BOLUS (SEPSIS): 500.0000 mL | Freq: Once | INTRAVENOUS | Status: DC

## 2016-04-15 MED ORDER — ONDANSETRON HCL 40 MG/20ML IJ SOLN
8.0000 mg | Freq: Three times a day (TID) | INTRAMUSCULAR | Status: DC | PRN
  Administered 2016-04-15 – 2016-04-16 (×4): 8 mg via INTRAVENOUS
  Filled 2016-04-15 (×7): qty 4

## 2016-04-15 NOTE — Progress Notes (Signed)
HD #2 Admitted with Elevated bp and ruq pain to rule out preeclampsia.   Subjective: pt reports increase in RUQ pain after eating last night. She received 10 mg of oxycodone with minimal improvement in pain . Current Headache. No visual disturbances. + FM  No lof cramping and occasional contraction overnight.   Objective:  Vitals. Current  134/94 ( 103/80-144/86) HR 138 (89-138) Resp 16 temp 98.2  General: Alert and Oriented No Acute Distress CV: tachycardic regular rhythm  Lungs Clear to auscultation Bilaterally   Abdomen Gravid ..tender to palpation in the RUQ no rebound no guarding. + BS Ext no edema 2+ reflexes   FHR Baseline 120 moderate variability + accelerations no decelerations.  Toco no contractions.    Results for orders placed or performed during the hospital encounter of 04/14/16 (from the past 24 hour(s))  Protein / creatinine ratio, urine     Status: None   Collection Time: 04/14/16  4:15 PM  Result Value Ref Range   Creatinine, Urine 44.00 mg/dL   Total Protein, Urine <6 mg/dL   Protein Creatinine Ratio        0.00 - 0.15 mg/mg[Cre]  CBC on admission     Status: Abnormal   Collection Time: 04/14/16  4:30 PM  Result Value Ref Range   WBC 12.5 (H) 4.0 - 10.5 K/uL   RBC 4.26 3.87 - 5.11 MIL/uL   Hemoglobin 12.5 12.0 - 15.0 g/dL   HCT 09.836.5 11.936.0 - 14.746.0 %   MCV 85.7 78.0 - 100.0 fL   MCH 29.3 26.0 - 34.0 pg   MCHC 34.2 30.0 - 36.0 g/dL   RDW 82.913.8 56.211.5 - 13.015.5 %   Platelets 305 150 - 400 K/uL  Comprehensive metabolic panel     Status: Abnormal   Collection Time: 04/14/16  4:30 PM  Result Value Ref Range   Sodium 137 135 - 145 mmol/L   Potassium 3.5 3.5 - 5.1 mmol/L   Chloride 107 101 - 111 mmol/L   CO2 22 22 - 32 mmol/L   Glucose, Bld 110 (H) 65 - 99 mg/dL   BUN <5 (L) 6 - 20 mg/dL   Creatinine, Ser 8.650.44 0.44 - 1.00 mg/dL   Calcium 9.0 8.9 - 78.410.3 mg/dL   Total Protein 6.6 6.5 - 8.1 g/dL   Albumin 3.2 (L) 3.5 - 5.0 g/dL   AST 17 15 - 41 U/L   ALT 12 (L) 14 - 54  U/L   Alkaline Phosphatase 79 38 - 126 U/L   Total Bilirubin 0.4 0.3 - 1.2 mg/dL   GFR calc non Af Amer >60 >60 mL/min   GFR calc Af Amer >60 >60 mL/min   Anion gap 8 5 - 15  Lactate dehydrogenase     Status: None   Collection Time: 04/14/16  4:30 PM  Result Value Ref Range   LDH 134 98 - 192 U/L  Type and screen     Status: None   Collection Time: 04/14/16  4:30 PM  Result Value Ref Range   ABO/RH(D) A POS    Antibody Screen NEG    Sample Expiration 04/17/2016   Uric acid     Status: None   Collection Time: 04/14/16  4:30 PM  Result Value Ref Range   Uric Acid, Serum 3.3 2.3 - 6.6 mg/dL  ABO/Rh     Status: None   Collection Time: 04/14/16  4:30 PM  Result Value Ref Range   ABO/RH(D) A POS    A/P 31  wks and 1 day with Gestational hypertension and RUQ pain.   1) Abdominal Ultrasound this morning.  2) MFM consult this morning for recommendations.  3) Continue Betamethasone to complete 2 doses. 2nd dose due today at 1635.  4) tachycardia may be due to anxiety ... Plan 500 cc bolus of IV fluid

## 2016-04-15 NOTE — Progress Notes (Signed)
Pt told per Dr. Richardson Doppole OK to eat counseled pt she needs to make bland, low fat, non spicy choices. Pt voiced understanding

## 2016-04-15 NOTE — Consult Note (Signed)
Maternal Fetal Medicine Consultation  Requesting Provider(s): Gerald Leitzara Cole, MD  Primary [facility]: [facility]  Reason for consultation: RUQ pain, gestational hypertension  HPI: Vanessa Jenkins is a 25 yo G3P1102, EDD 06/16/2016 who is currently at 31w 1d admitted due to RUQ pain and mildly elevated blood pressures.  Vanessa Jenkins reports a history of a prior 32 week delivery due to preeclampsia - was admitted for "similar symptoms" - headaches, RUQ pain and was about ready for discharge when she developed worsening symptoms that required emergent delivery.  She underwent cesarean delivery for non-reassuring fetal tracing.  Her second delivery was a scheduled repeat C-section at 39 weeks.  That pregnancy was complicated by preterm contractions.  Vanessa Jenkins reports that she developed severe RUQ pain on the day prior to admission - seems to be associated with eating.  The patient had a RUQ ultrasound today that did not show evidence of gallstones or hepatic pathology.  Her LFTs were within normal limits.  She has required Oxycodone for pain control.  Vanessa Jenkins reports frequent headaches with "floaters" - has a history of migraine headaches that has required Fioricet.  The fetus is active. She reports irregular uterine contractions.  The patient reports that she had a normal growth ultrasound in clinic approximately 2 weeks ago.  OB History: OB History    Gravida Para Term Preterm AB Living   3 2 1 1   2    SAB TAB Ectopic Multiple Live Births                  PMH:  Past Medical History:  Diagnosis Date  . Anxiety   . Pregnancy induced hypertension   . Preterm labor     PSH:  Past Surgical History:  Procedure Laterality Date  . CESAREAN SECTION     Meds:  No current facility-administered medications on file prior to encounter.    Current Outpatient Prescriptions on File Prior to Encounter  Medication Sig Dispense Refill  . butalbital-acetaminophen-caffeine (FIORICET, ESGIC) 50-325-40 MG tablet Take 1  tablet by mouth 2 (two) times daily as needed for headache or migraine.    . Prenatal Vit-Fe Fumarate-FA (PRENATAL MULTIVITAMIN) TABS tablet Take 1 tablet by mouth daily at 12 noon.    . ondansetron (ZOFRAN ODT) 4 MG disintegrating tablet Take 1 tablet (4 mg total) by mouth every 8 (eight) hours as needed for nausea or vomiting. (Patient not taking: Reported on 04/14/2016) 15 tablet 0  . promethazine (PHENERGAN) 25 MG suppository Place 1 suppository (25 mg total) rectally every 6 (six) hours as needed for nausea or vomiting. (Patient not taking: Reported on 04/14/2016) 15 each 0   Allergies:  Allergies  Allergen Reactions  . Sulfa Antibiotics Swelling and Rash  . Shellfish Allergy Swelling and Rash  . Sodium Citrate Rash   FH:  Family History  Problem Relation Age of Onset  . Diabetes Mother   . Diabetes Father   . Diabetes Maternal Grandmother   . Diabetes Maternal Grandfather   . Diabetes Paternal Grandmother   . Diabetes Paternal Grandfather    Soc:  Social History   Social History  . Marital status: Married    Spouse name: N/A  . Number of children: N/A  . Years of education: N/A   Occupational History  . Not on file.   Social History Main Topics  . Smoking status: Never Smoker  . Smokeless tobacco: Never Used  . Alcohol use No  . Drug use: No  . Sexual  activity: Yes    Birth control/ protection: None   Other Topics Concern  . Not on file   Social History Narrative  . No narrative on file   Review of Systems: no vaginal bleeding or cramping/contractions, no LOF, no nausea/vomiting. All other systems reviewed and are negative.  PE:   Vitals:   04/15/16 0614 04/15/16 0823  BP: (!) 134/94 129/87  Pulse: (!) 138 (!) 125  Resp: 16 18  Temp:  98.2 F (36.8 C)    GEN: well-appearing female ABD: gravid, NT  Labs: CBC    Component Value Date/Time   WBC 12.5 (H) 04/14/2016 1630   RBC 4.26 04/14/2016 1630   HGB 12.5 04/14/2016 1630   HCT 36.5 04/14/2016  1630   PLT 305 04/14/2016 1630   MCV 85.7 04/14/2016 1630   MCH 29.3 04/14/2016 1630   MCHC 34.2 04/14/2016 1630   RDW 13.8 04/14/2016 1630   LYMPHSABS 2.3 10/15/2015 1812   MONOABS 0.5 10/15/2015 1812   EOSABS 0.1 10/15/2015 1812   BASOSABS 0.0 10/15/2015 1812   CMP     Component Value Date/Time   NA 137 04/14/2016 1630   K 3.5 04/14/2016 1630   CL 107 04/14/2016 1630   CO2 22 04/14/2016 1630   GLUCOSE 110 (H) 04/14/2016 1630   BUN <5 (L) 04/14/2016 1630   CREATININE 0.44 04/14/2016 1630   CALCIUM 9.0 04/14/2016 1630   PROT 6.6 04/14/2016 1630   ALBUMIN 3.2 (L) 04/14/2016 1630   AST 17 04/14/2016 1630   ALT 12 (L) 04/14/2016 1630   ALKPHOS 79 04/14/2016 1630   BILITOT 0.4 04/14/2016 1630   GFRNONAA >60 04/14/2016 1630   GFRAA >60 04/14/2016 1630   Uric Acid: 3.3  UA - neg protein.  P/C ratio not calculated (total protein <6)   A/P: 1) Single IUP at 31w 1d  2) Gestational hypertension - the patient has had some labile blood pressures, but not in the severe range.  She meets criteria for this diagnosis but does not have any laboratory findings suggestive of preeclampsia.  I suspect that the patient's anxiety at least in some way plays into this picture. Concur with course of betamethasone, but cannot find any indication for delivery at this time.  3) RUQ pain - RUQ ultrasound was within normal limits.  Normal LFTs - feel that HELLP syndrome / severe preeclampsia is extremely unlikely.  May consider a trial of Protonix.  If the patient continues to experience severe RUQ pain, would also recommend a GI consultation.  Would be careful treating with Opioids for any extended period of time, particularly without a firm diagnosis.  Recommendations: 1) Betamethasone as written 2) Would check 24-hr urine protein while the patient is in-house 3) Trial of Protonix for RUQ pain 4) If pain continues, consider GI consultation - feel that HELLP syndrome is extremely unlikely at this time  given the clinical picture. 5) Once deemed stable for discharge, would recommend close outpatient management - at least weekly clinic visits for blood pressure checks, preeclampsia lab.  Would start antenatal testing (2x weekly NSTs with weekly AFIs or weekly BPPs after discharge. 6) Recommend delivery by [redacted] weeks gestation if otherwise stable (if the patient does not develop preeclampsia with severe features)  Thank you for the opportunity to be a part of the care of [employer]. Please contact our office if we can be of further assistance.   I spent approximately 30 minutes with this patient with over 50% of time  spent in face-to-face counseling.  Alpha GulaPaul Harlem Bula, MD Maternal Fetal Medicine

## 2016-04-16 DIAGNOSIS — R1011 Right upper quadrant pain: Secondary | ICD-10-CM | POA: Diagnosis present

## 2016-04-16 DIAGNOSIS — O139 Gestational [pregnancy-induced] hypertension without significant proteinuria, unspecified trimester: Secondary | ICD-10-CM | POA: Diagnosis present

## 2016-04-16 LAB — PROTEIN, URINE, 24 HOUR
COLLECTION INTERVAL-UPROT: 24 h
Urine Total Volume-UPROT: 4250 mL

## 2016-04-16 LAB — URINALYSIS, ROUTINE W REFLEX MICROSCOPIC
BILIRUBIN URINE: NEGATIVE
Glucose, UA: NEGATIVE mg/dL
Hgb urine dipstick: NEGATIVE
KETONES UR: NEGATIVE mg/dL
NITRITE: NEGATIVE
PH: 6.5 (ref 5.0–8.0)
Protein, ur: NEGATIVE mg/dL
Specific Gravity, Urine: 1.01 (ref 1.005–1.030)

## 2016-04-16 LAB — CULTURE, BETA STREP (GROUP B ONLY)

## 2016-04-16 LAB — URINE MICROSCOPIC-ADD ON
BACTERIA UA: NONE SEEN
RBC / HPF: NONE SEEN RBC/hpf (ref 0–5)

## 2016-04-16 MED ORDER — PANTOPRAZOLE SODIUM 40 MG PO TBEC: 40.0000 mg | DELAYED_RELEASE_TABLET | Freq: Every day | ORAL | 0 refills | Status: DC

## 2016-04-16 MED ORDER — BUTALBITAL-APAP-CAFFEINE 50-325-40 MG PO TABS: 2.0000 | ORAL_TABLET | ORAL | 1 refills | Status: DC | PRN

## 2016-04-16 MED ORDER — HYDROXYPROGESTERONE CAPROATE 250 MG/ML IM OIL
250.0000 mg | TOPICAL_OIL | Freq: Once | INTRAMUSCULAR | Status: AC
  Administered 2016-04-16: 250 mg via INTRAMUSCULAR
  Filled 2016-04-16: qty 1

## 2016-04-16 NOTE — Progress Notes (Signed)
Patient ID: Vanessa Jenkins, female   DOB: January 01, 1991, 25 y.o.   MRN: [id number]   HD #3 Admitted with Elevated bp and ruq pain to rule out preeclampsia.   Subjective:  Patient still has ruq pain. Minimal improvement with oxycodone. Pt feels anxious. She stopped buspar 3 weeks ago. shehas a headache that is intermittent.  +FM no lof saw minimal amount of spotting when she used the restroom. .. No contractions. Cramping at times   Objective:  Vitals:   04/16/16 0804 04/16/16 1307  BP: 125/90 118/81  Pulse: 81 (!) 106  Resp: 15 16  Temp: 98.4 F (36.9 C) 98.8 F (37.1 C)    General: Alert and Oriented No Acute Distress CV: tachycardic regular rhythm  Lungs Clear to auscultation Bilaterally   Abdomen Gravid ..tender to palpation in the RUQ no rebound no guarding. + BS Ext no edema 2+ reflexes   FHR Baseline 120 moderate variability + accelerations no decelerations.  Toco no contractions.    Lab Results Last 24 Hours       Results for orders placed or performed during the hospital encounter of 04/14/16 (from the past 24 hour(s))  Protein / creatinine ratio, urine     Status: None   Collection Time: 04/14/16  4:15 PM  Result Value Ref Range   Creatinine, Urine 44.00 mg/dL   Total Protein, Urine <6 mg/dL   Protein Creatinine Ratio        0.00 - 0.15 mg/mg[Cre]  CBC on admission     Status: Abnormal   Collection Time: 04/14/16  4:30 PM  Result Value Ref Range   WBC 12.5 (H) 4.0 - 10.5 K/uL   RBC 4.26 3.87 - 5.11 MIL/uL   Hemoglobin 12.5 12.0 - 15.0 g/dL   HCT 10.236.5 72.536.0 - 36.646.0 %   MCV 85.7 78.0 - 100.0 fL   MCH 29.3 26.0 - 34.0 pg   MCHC 34.2 30.0 - 36.0 g/dL   RDW 44.013.8 34.711.5 - 42.515.5 %   Platelets 305 150 - 400 K/uL  Comprehensive metabolic panel     Status: Abnormal   Collection Time: 04/14/16  4:30 PM  Result Value Ref Range   Sodium 137 135 - 145 mmol/L   Potassium 3.5 3.5 - 5.1 mmol/L   Chloride 107 101 - 111 mmol/L   CO2 22 22 - 32 mmol/L    Glucose, Bld 110 (H) 65 - 99 mg/dL   BUN <5 (L) 6 - 20 mg/dL   Creatinine, Ser 9.560.44 0.44 - 1.00 mg/dL   Calcium 9.0 8.9 - 38.710.3 mg/dL   Total Protein 6.6 6.5 - 8.1 g/dL   Albumin 3.2 (L) 3.5 - 5.0 g/dL   AST 17 15 - 41 U/L   ALT 12 (L) 14 - 54 U/L   Alkaline Phosphatase 79 38 - 126 U/L   Total Bilirubin 0.4 0.3 - 1.2 mg/dL   GFR calc non Af Amer >60 >60 mL/min   GFR calc Af Amer >60 >60 mL/min   Anion gap 8 5 - 15  Lactate dehydrogenase     Status: None   Collection Time: 04/14/16  4:30 PM  Result Value Ref Range   LDH 134 98 - 192 U/L  Type and screen     Status: None   Collection Time: 04/14/16  4:30 PM  Result Value Ref Range   ABO/RH(D) A POS    Antibody Screen NEG    Sample Expiration 04/17/2016   Uric acid     Status:  None   Collection Time: 04/14/16  4:30 PM  Result Value Ref Range   Uric Acid, Serum 3.3 2.3 - 6.6 mg/dL  ABO/Rh     Status: None   Collection Time: 04/14/16  4:30 PM  Result Value Ref Range   ABO/RH(D) A POS      Assessment/Plan  1) 31 wks and 2 day with Gestational hypertension  Appreciate MFM consult . Plan to start antenatal testing 2 times a week outpatient.  2) RUQ pain - normal abdominal ultrasound - plan GI consult outpatient  3) Anxiety - pt to restart buspar 4) h/o Preterm delivery -Makena weekly until 36 wks  5) Discharge home today. Follow up in office to begin Antenatal testing Monday 04/21/2016.

## 2016-04-16 NOTE — Discharge Summary (Signed)
Physician Discharge Summary  Patient ID: Vanessa Jenkins MRN: [id number] DOB/AGE: 01-Nov-1990 25 y.o.  Admit date: 04/14/2016 Discharge date: 04/16/2016  Admission Diagnoses:1) elevated blood pressure affecting pregnancy in the third trimester 2) RUQ pain 3) h/o cesarean section 4) h/o preterm delivery   Discharge Diagnoses:  Active Problems:   Elevated blood pressure affecting pregnancy in third trimester, antepartum   Gestational hypertension   Right upper quadrant pain   Discharged Condition: stable  Hospital Course: pt was admitted on 04/14/2016 after being seen in the office complaining of RUQ pain. BP in the office was 135/114 and repeat 130/100. She was admitted for serial blood pressures and PIH labs. PCR was normal. PIH labs were normal. She had a normal u/s of the RUQ. Bethamethasone was given for fetal lung maturity.  MFM consult was obtained. Please see consult note. Pt discharge home in stable condition. Plan GI consult outpatient   Consults: Maternal fetal care   Significant Diagnostic Studies:  Results for orders placed or performed during the hospital encounter of 04/14/16 (from the past 48 hour(s))  Protein, urine, 24 hour     Status: None   Collection Time: 04/15/16  1:15 PM  Result Value Ref Range   Urine Total Volume-UPROT 4,250 mL   Collection Interval-UPROT 24 hours   Protein, Urine <6 mg/dL    Comment: REPEATED TO VERIFY   Protein, 24H Urine        50 - 100 mg/day    Comment: RESULT BELOW REPORTABLE RANGE, UNABLE TO CALCULATE. [facility]    Treatments: IV hydration and steroids: bethamethasone   Disposition: 01-Home or Self Care  Discharge Instructions    Discharge activity:  No Restrictions    Complete by:  As directed    Discharge diet:  No restrictions    Complete by:  As directed    Do not have sex or do anything that might make you have an orgasm    Complete by:  As directed    Fetal Kick Count:  Lie on our left side for one  hour after a meal, and count the number of times your baby kicks.  If it is less than 5 times, get up, move around and drink some juice.  Repeat the test 30 minutes later.  If it is still less than 5 kicks in an hour, notify your doctor.    Complete by:  As directed    Notify physician for a general feeling that "something is not right"    Complete by:  As directed    Notify physician for increase or change in vaginal discharge    Complete by:  As directed    Notify physician for intestinal cramps, with or without diarrhea, sometimes described as "gas pain"    Complete by:  As directed    Notify physician for leaking of fluid    Complete by:  As directed    Notify physician for low, dull backache, unrelieved by heat or Tylenol    Complete by:  As directed    Notify physician for menstrual like cramps    Complete by:  As directed    Notify physician for pelvic pressure    Complete by:  As directed    Notify physician for uterine contractions.  These may be painless and feel like the uterus is tightening or the baby is  "balling up"    Complete by:  As directed    Notify physician for vaginal bleeding  Complete by:  As directed    PRETERM LABOR:  Includes any of the follwing symptoms that occur between 20 - [redacted] weeks gestation.  If these symptoms are not stopped, preterm labor can result in preterm delivery, placing your baby at risk    Complete by:  As directed       Follow-up Information    Jessee AversOLE,Brennan Karam J., MD. Schedule an appointment as soon as possible for a visit on 04/21/2016.   Specialty:  Obstetrics and Gynecology Why:  will call pt with appt for u/s for afi and NST  as well as appointment with Dr. Cathe Monsole  Contact information: [address] [place] [address] [phone]           Signed: Jessee AversCOLE,Maddix Kliewer J. 04/16/2016, 5:16 PM

## 2016-04-16 NOTE — Discharge Instructions (Signed)
Biliary Colic °Biliary colic is a pain in the upper abdomen. The pain: °· Is usually felt on the right side of the abdomen, but it may also be felt in the center of the abdomen, just below the breastbone (sternum). °· May spread back toward the right shoulder blade. °· May be steady or irregular. °· May be accompanied by nausea and vomiting. °Most of the time, the pain goes away in 1-5 hours. After the most intense pain passes, the abdomen may continue to ache mildly for about 24 hours. °Biliary colic is caused by a blockage in the bile duct. The bile duct is a pathway that carries bile--a liquid that helps to digest fats--from the gallbladder to the small intestine. Biliary colic usually occurs after eating, when the digestive system demands bile. The pain develops when muscle cells contract forcefully to try to move the blockage so that bile can get by. °HOME CARE INSTRUCTIONS °· Take medicines only as directed by your health care provider. °· Drink enough fluid to keep your urine clear or pale yellow. °· Avoid fatty, greasy, and fried foods. These kinds of foods increase your body's demand for bile. °· Avoid any foods that make your pain worse. °· Avoid overeating. °· Avoid having a large meal after fasting. °SEEK MEDICAL CARE IF: °· You develop a fever. °· Your pain gets worse. °· You vomit. °· You develop nausea that prevents you from eating and drinking. °SEEK IMMEDIATE MEDICAL CARE IF: °· You suddenly develop a fever and shaking chills. °· You develop a yellowish discoloration (jaundice) of: °¨ Skin. °¨ Whites of the eyes. °¨ Mucous membranes. °· You have continuous or severe pain that is not relieved with medicines. °· You have nausea and vomiting that is not relieved with medicines. °· You develop dizziness or you faint. °  °This information is not intended to replace advice given to you by your health care provider. Make sure you discuss any questions you have with your health care provider. °  °Document  Released: 10/27/2005 Document Revised: 10/10/2014 Document Reviewed: 03/07/2014 °Elsevier Interactive Patient Education ©2016 [employer] ° °

## 2016-04-16 NOTE — Progress Notes (Signed)
Pt called out, states spotting. When nurse enters room, pt states it was on a washcloth and she washed it down the sink. Pt given panties and pad, instructed to notify and show nurse spotting or bleeding.

## 2016-04-16 NOTE — Progress Notes (Signed)
Discharge instructions given, questions answered, states understanding, signs and given copy 

## 2016-04-18 LAB — URINE CULTURE

## 2016-04-30 ENCOUNTER — Other Ambulatory Visit: Payer: Self-pay | Admitting: Obstetrics and Gynecology

## 2016-05-19 ENCOUNTER — Telehealth (HOSPITAL_COMMUNITY): Payer: Self-pay | Admitting: *Deleted

## 2016-05-19 NOTE — Telephone Encounter (Signed)
Preadmission screen  

## 2016-05-20 ENCOUNTER — Encounter (HOSPITAL_COMMUNITY): Payer: Self-pay

## 2016-05-26 ENCOUNTER — Other Ambulatory Visit: Payer: Self-pay | Admitting: Obstetrics and Gynecology

## 2016-05-26 ENCOUNTER — Encounter (HOSPITAL_COMMUNITY)
Admission: RE | Admit: 2016-05-26 | Discharge: 2016-05-26 | Disposition: A | Discharge status: Home / Self Care | Payer: Medicaid Other | Source: Ambulatory Visit | Attending: Obstetrics and Gynecology | Admitting: Obstetrics and Gynecology

## 2016-05-26 LAB — TYPE AND SCREEN
ABO/RH(D): A POS
ANTIBODY SCREEN: NEGATIVE

## 2016-05-26 LAB — CBC
HEMATOCRIT: 34.5 % — AB (ref 36.0–46.0)
HEMOGLOBIN: 11.6 g/dL — AB (ref 12.0–15.0)
MCH: 28.2 pg (ref 26.0–34.0)
MCHC: 33.6 g/dL (ref 30.0–36.0)
MCV: 83.9 fL (ref 78.0–100.0)
Platelets: 317 10*3/uL (ref 150–400)
RBC: 4.11 MIL/uL (ref 3.87–5.11)
RDW: 14.4 % (ref 11.5–15.5)
WBC: 10.2 10*3/uL (ref 4.0–10.5)

## 2016-05-26 LAB — BASIC METABOLIC PANEL
ANION GAP: 8 (ref 5–15)
BUN: 7 mg/dL (ref 6–20)
CALCIUM: 8.6 mg/dL — AB (ref 8.9–10.3)
CO2: 23 mmol/L (ref 22–32)
CREATININE: 0.41 mg/dL — AB (ref 0.44–1.00)
Chloride: 105 mmol/L (ref 101–111)
Glucose, Bld: 94 mg/dL (ref 65–99)
Potassium: 3.7 mmol/L (ref 3.5–5.1)
SODIUM: 136 mmol/L (ref 135–145)

## 2016-05-26 NOTE — H&P (Deleted)
  The note originally documented on this encounter has been moved the the encounter in which it belongs.  

## 2016-05-26 NOTE — Patient Instructions (Signed)
[address] Eula Listenshley Socarras  05/26/2016   Your procedure is scheduled on:  05/27/2016  Enter through the Main Entrance of [facility] at 0600 AM.  Pick up the phone at the desk and dial [phone].   Call this number if you have problems the morning of surgery: [phone]   Remember:   Do not eat food:After Midnight.  Do not drink clear liquids: After Midnight.  Take these medicines the morning of surgery with A SIP OF WATER: buspar   Do not wear jewelry, make-up or nail polish.  Do not wear lotions, powders, or perfumes. Do not wear deodorant.  Do not shave 48 hours prior to surgery.  Do not bring valuables to the hospital.  [facility] is not   responsible for any belongings or valuables brought to the hospital.  Contacts, dentures or bridgework may not be worn into surgery.  Leave suitcase in the car. After surgery it may be brought to your room.  For patients admitted to the hospital, checkout time is 11:00 AM the day of              discharge.   Patients discharged the day of surgery will not be allowed to drive             home.  Name and phone number of your driver: na   Special Instructions:   N/A   Please read over the following fact sheets that you were given:   Surgical Site Infection Prevention

## 2016-05-26 NOTE — H&P (Signed)
Vanessa Jenkins is a 25 y.o. female presenting for repeat cesarean section and bilateral tubal ligation. Prenatal care provided by Dr[facility] Gerald Leitzara Idabell Picking [facility]. Pregnancy complicated by gestational hypertension h/o cesarean section x 2 and h/o preterm delivery. Pt reports fetal movement. No lof no vaginal bleeding. No regular contractions.    OB History    Gravida Para Term Preterm AB Living   3 2 1 1   2    SAB TAB Ectopic Multiple Live Births                 Past Medical History:  Diagnosis Date  . Anxiety   . Pregnancy induced hypertension   . Preterm labor    Past Surgical History:  Procedure Laterality Date  . CESAREAN SECTION    Cesarean section x 2.   Family History: family history includes Diabetes in her father, maternal grandfather, maternal grandmother, mother, paternal grandfather, and paternal grandmother. Social History:  reports that she has never smoked. She has never used smokeless tobacco. She reports that she does not drink alcohol or use drugs.     Maternal Diabetes: No Genetic Screening: Normal Maternal Ultrasounds/Referrals: Normal Fetal Ultrasounds or other Referrals:  None Maternal Substance Abuse:  No Significant Maternal Medications:  None Significant Maternal Lab Results:  None Other Comments:  patient recieved steroid for fetal lung maturity   Review of Systems  Constitutional: Negative.   HENT: Negative.   Eyes: Negative.   Respiratory: Negative.   Cardiovascular: Negative.   Gastrointestinal: Negative.   Genitourinary: Negative.   Musculoskeletal: Negative.   Skin: Negative.   Neurological: Negative.   Endo/Heme/Allergies: Negative.   Psychiatric/Behavioral: Negative.    Maternal Medical History:  Fetal activity: Perceived fetal activity is normal.    Prenatal complications: Gestational hypertension.  H/o cesarean section x 2 H/O preterm delivery at 34 wks.    Prenatal Complications - Diabetes: none.      Last menstrual period  09/10/2015. Maternal Exam:  Abdomen: Patient reports no abdominal tenderness. Surgical scars: low transverse.   Fetal presentation: vertex  Introitus: Normal vulva. Normal vagina.    Fetal Exam Fetal State Assessment: Category I - tracings are normal.     Physical Exam  Vitals reviewed. Constitutional: She is oriented to person, place, and time. She appears well-developed and well-nourished.  HENT:  Head: Normocephalic and atraumatic.  Eyes: Conjunctivae are normal. Pupils are equal, round, and reactive to light.  Neck: Normal range of motion. Neck supple.  Cardiovascular: Normal rate and regular rhythm.   Respiratory: Effort normal and breath sounds normal.  GI: Bowel sounds are normal. There is no tenderness.  Genitourinary: Vagina normal.  Musculoskeletal: Normal range of motion.  Neurological: She is alert and oriented to person, place, and time. She has normal reflexes.  Skin: Skin is warm and dry.  Psychiatric: She has a normal mood and affect.    Prenatal labs: ABO, Rh: --/--/A POS (12/18 1155) Antibody: NEG (12/18 1155) Rubella: Immune (05/19 0000) RPR: Nonreactive (05/19 0000)  HBsAg: Negative (05/19 0000)  HIV: Non-reactive (05/19 0000)  GBS:     Assessment/Plan: 37 wks and 1 day with gestationalhypertension.  H/o cesarean section x2 pt desires repeat cesarean section and bilateral tubal ligation. R/b/a of surgery discussed with the patient including but not limited to infection, bleeding, damage to bowel bladder baby with the need for further surgery. R/o transfusion discussed. R/o Tubal ligation discussed as well as 1 % r/o failure with 50 % r/o ectopic pregnancy if  failure occurs . pt voiced understanding and desires to proceed.    Vanessa Jenkins J. 05/26/2016, 10:17 PM

## 2016-05-27 ENCOUNTER — Inpatient Hospital Stay (HOSPITAL_COMMUNITY): Payer: Medicaid Other | Admitting: Anesthesiology

## 2016-05-27 ENCOUNTER — Encounter (HOSPITAL_COMMUNITY): Payer: Self-pay

## 2016-05-27 ENCOUNTER — Encounter (HOSPITAL_COMMUNITY)
Admission: RE | Disposition: A | Discharge status: Home / Self Care | Payer: Self-pay | Source: Ambulatory Visit | Attending: Obstetrics and Gynecology

## 2016-05-27 ENCOUNTER — Inpatient Hospital Stay (HOSPITAL_COMMUNITY)
Admission: RE | Admit: 2016-05-27 | Discharge: 2016-05-29 | DRG: 766 | Disposition: A | Discharge status: Home / Self Care | Payer: Medicaid Other | Source: Ambulatory Visit | Attending: Obstetrics and Gynecology | Admitting: Obstetrics and Gynecology

## 2016-05-27 DIAGNOSIS — O9962 Diseases of the digestive system complicating childbirth: Secondary | ICD-10-CM | POA: Diagnosis present

## 2016-05-27 DIAGNOSIS — Z3A37 37 weeks gestation of pregnancy: Secondary | ICD-10-CM | POA: Diagnosis not present

## 2016-05-27 DIAGNOSIS — O34211 Maternal care for low transverse scar from previous cesarean delivery: Secondary | ICD-10-CM | POA: Diagnosis present

## 2016-05-27 DIAGNOSIS — O134 Gestational [pregnancy-induced] hypertension without significant proteinuria, complicating childbirth: Secondary | ICD-10-CM | POA: Diagnosis present

## 2016-05-27 DIAGNOSIS — R21 Rash and other nonspecific skin eruption: Secondary | ICD-10-CM | POA: Diagnosis not present

## 2016-05-27 DIAGNOSIS — K219 Gastro-esophageal reflux disease without esophagitis: Secondary | ICD-10-CM | POA: Diagnosis present

## 2016-05-27 DIAGNOSIS — Z302 Encounter for sterilization: Secondary | ICD-10-CM

## 2016-05-27 DIAGNOSIS — Z98891 History of uterine scar from previous surgery: Secondary | ICD-10-CM

## 2016-05-27 LAB — RPR: RPR: NONREACTIVE

## 2016-05-27 SURGERY — Surgical Case
Anesthesia: Regional

## 2016-05-27 MED ORDER — DIBUCAINE 1 % RE OINT
1.0000 "application " | TOPICAL_OINTMENT | RECTAL | Status: DC | PRN
  Filled 2016-05-27: qty 28

## 2016-05-27 MED ORDER — SODIUM CHLORIDE 0.9% FLUSH: 3.0000 mL | INTRAVENOUS | Status: DC | PRN

## 2016-05-27 MED ORDER — SIMETHICONE 80 MG PO CHEW
80.0000 mg | CHEWABLE_TABLET | ORAL | Status: DC | PRN
  Filled 2016-05-27: qty 1

## 2016-05-27 MED ORDER — NALOXONE HCL 0.4 MG/ML IJ SOLN: 0.4000 mg | INTRAMUSCULAR | Status: DC | PRN

## 2016-05-27 MED ORDER — MORPHINE SULFATE (PF) 0.5 MG/ML IJ SOLN
INTRAMUSCULAR | Status: DC | PRN
  Administered 2016-05-27: .2 mg via INTRATHECAL

## 2016-05-27 MED ORDER — BUSPIRONE HCL 5 MG PO TABS
5.0000 mg | ORAL_TABLET | Freq: Two times a day (BID) | ORAL | Status: DC
  Administered 2016-05-27 – 2016-05-28 (×2): 5 mg via ORAL
  Filled 2016-05-27 (×4): qty 1

## 2016-05-27 MED ORDER — IBUPROFEN 600 MG PO TABS
600.0000 mg | ORAL_TABLET | Freq: Four times a day (QID) | ORAL | Status: DC
  Administered 2016-05-28 – 2016-05-29 (×4): 600 mg via ORAL
  Filled 2016-05-27 (×4): qty 1

## 2016-05-27 MED ORDER — MENTHOL 3 MG MT LOZG
1.0000 | LOZENGE | OROMUCOSAL | Status: DC | PRN
  Filled 2016-05-27: qty 9

## 2016-05-27 MED ORDER — NALBUPHINE HCL 10 MG/ML IJ SOLN: 5.0000 mg | Freq: Once | INTRAMUSCULAR | Status: DC | PRN

## 2016-05-27 MED ORDER — PHENYLEPHRINE 8 MG IN D5W 100 ML (0.08MG/ML) PREMIX OPTIME
INJECTION | INTRAVENOUS | Status: DC | PRN
  Administered 2016-05-27: 60 ug/min via INTRAVENOUS

## 2016-05-27 MED ORDER — SIMETHICONE 80 MG PO CHEW
80.0000 mg | CHEWABLE_TABLET | ORAL | Status: DC
  Administered 2016-05-27 – 2016-05-28 (×2): 80 mg via ORAL
  Filled 2016-05-27 (×4): qty 1

## 2016-05-27 MED ORDER — ONDANSETRON HCL 4 MG/2ML IJ SOLN: 4.0000 mg | Freq: Three times a day (TID) | INTRAMUSCULAR | Status: DC | PRN

## 2016-05-27 MED ORDER — MEPERIDINE HCL 25 MG/ML IJ SOLN: 6.2500 mg | INTRAMUSCULAR | Status: DC | PRN

## 2016-05-27 MED ORDER — FENTANYL CITRATE (PF) 100 MCG/2ML IJ SOLN
INTRAMUSCULAR | Status: DC | PRN
  Administered 2016-05-27: 10 ug via INTRATHECAL

## 2016-05-27 MED ORDER — PROMETHAZINE HCL 25 MG/ML IJ SOLN: 6.2500 mg | INTRAMUSCULAR | Status: DC | PRN

## 2016-05-27 MED ORDER — LACTATED RINGERS IV SOLN
Freq: Once | INTRAVENOUS | Status: AC
  Administered 2016-05-27: 06:00:00 via INTRAVENOUS

## 2016-05-27 MED ORDER — KETOROLAC TROMETHAMINE 30 MG/ML IJ SOLN
INTRAMUSCULAR | Status: AC
  Administered 2016-05-27: 30 mg via INTRAMUSCULAR
  Filled 2016-05-27: qty 1

## 2016-05-27 MED ORDER — FERROUS SULFATE 325 (65 FE) MG PO TABS
325.0000 mg | ORAL_TABLET | Freq: Two times a day (BID) | ORAL | Status: DC
  Administered 2016-05-27 – 2016-05-29 (×4): 325 mg via ORAL
  Filled 2016-05-27 (×7): qty 1

## 2016-05-27 MED ORDER — LACTATED RINGERS IV BOLUS (SEPSIS)
250.0000 mL | Freq: Once | INTRAVENOUS | Status: AC
  Administered 2016-05-27: 250 mL via INTRAVENOUS

## 2016-05-27 MED ORDER — ACETAMINOPHEN 325 MG PO TABS
650.0000 mg | ORAL_TABLET | ORAL | Status: DC | PRN
  Administered 2016-05-27 – 2016-05-29 (×5): 650 mg via ORAL
  Filled 2016-05-27 (×8): qty 2

## 2016-05-27 MED ORDER — DIPHENHYDRAMINE HCL 25 MG PO CAPS
25.0000 mg | ORAL_CAPSULE | ORAL | Status: DC | PRN
  Administered 2016-05-27 – 2016-05-29 (×2): 25 mg via ORAL
  Filled 2016-05-27 (×2): qty 1

## 2016-05-27 MED ORDER — FENTANYL CITRATE (PF) 100 MCG/2ML IJ SOLN
INTRAMUSCULAR | Status: AC
  Filled 2016-05-27: qty 2

## 2016-05-27 MED ORDER — WITCH HAZEL-GLYCERIN EX PADS: 1.0000 "application " | MEDICATED_PAD | CUTANEOUS | Status: DC | PRN

## 2016-05-27 MED ORDER — CEFAZOLIN SODIUM-DEXTROSE 2-4 GM/100ML-% IV SOLN
2.0000 g | INTRAVENOUS | Status: AC
  Administered 2016-05-27: 2 g via INTRAVENOUS

## 2016-05-27 MED ORDER — SODIUM CHLORIDE 0.9 % IR SOLN
Status: DC | PRN
  Administered 2016-05-27: 1000 mL

## 2016-05-27 MED ORDER — OXYTOCIN 10 UNIT/ML IJ SOLN
INTRAMUSCULAR | Status: AC
  Filled 2016-05-27: qty 4

## 2016-05-27 MED ORDER — SCOPOLAMINE 1 MG/3DAYS TD PT72
1.0000 | MEDICATED_PATCH | Freq: Once | TRANSDERMAL | Status: DC
  Administered 2016-05-27: 1.5 mg via TRANSDERMAL

## 2016-05-27 MED ORDER — OXYCODONE HCL 5 MG PO TABS: 5.0000 mg | ORAL_TABLET | ORAL | Status: DC | PRN

## 2016-05-27 MED ORDER — ONDANSETRON HCL 4 MG/2ML IJ SOLN
INTRAMUSCULAR | Status: AC
  Filled 2016-05-27: qty 2

## 2016-05-27 MED ORDER — KETOROLAC TROMETHAMINE 30 MG/ML IJ SOLN
30.0000 mg | Freq: Four times a day (QID) | INTRAMUSCULAR | Status: AC | PRN
  Administered 2016-05-27: 30 mg via INTRAMUSCULAR

## 2016-05-27 MED ORDER — SCOPOLAMINE 1 MG/3DAYS TD PT72
MEDICATED_PATCH | TRANSDERMAL | Status: AC
  Administered 2016-05-27: 1.5 mg via TRANSDERMAL
  Filled 2016-05-27: qty 1

## 2016-05-27 MED ORDER — LACTATED RINGERS IV SOLN
INTRAVENOUS | Status: DC
  Administered 2016-05-27 – 2016-05-28 (×3): via INTRAVENOUS

## 2016-05-27 MED ORDER — PRENATAL MULTIVITAMIN CH
1.0000 | ORAL_TABLET | Freq: Every day | ORAL | Status: DC
  Administered 2016-05-28: 1 via ORAL
  Filled 2016-05-27 (×3): qty 1

## 2016-05-27 MED ORDER — LACTATED RINGERS IV SOLN
INTRAVENOUS | Status: DC
  Administered 2016-05-27 (×4): via INTRAVENOUS

## 2016-05-27 MED ORDER — BUPIVACAINE IN DEXTROSE 0.75-8.25 % IT SOLN
INTRATHECAL | Status: DC | PRN
  Administered 2016-05-27: 9 mg via INTRATHECAL

## 2016-05-27 MED ORDER — OXYTOCIN 40 UNITS IN LACTATED RINGERS INFUSION - SIMPLE MED: 2.5000 [IU]/h | INTRAVENOUS | Status: AC

## 2016-05-27 MED ORDER — ZOLPIDEM TARTRATE 5 MG PO TABS: 5.0000 mg | ORAL_TABLET | Freq: Every evening | ORAL | Status: DC | PRN

## 2016-05-27 MED ORDER — SIMETHICONE 80 MG PO CHEW
80.0000 mg | CHEWABLE_TABLET | Freq: Three times a day (TID) | ORAL | Status: DC
  Administered 2016-05-27 – 2016-05-29 (×5): 80 mg via ORAL
  Filled 2016-05-27 (×11): qty 1

## 2016-05-27 MED ORDER — ACETAMINOPHEN 325 MG PO TABS
650.0000 mg | ORAL_TABLET | Freq: Once | ORAL | Status: AC
  Administered 2016-05-27: 650 mg via ORAL

## 2016-05-27 MED ORDER — MORPHINE SULFATE-NACL 0.5-0.9 MG/ML-% IV SOSY
PREFILLED_SYRINGE | INTRAVENOUS | Status: AC
  Filled 2016-05-27: qty 1

## 2016-05-27 MED ORDER — DIPHENHYDRAMINE HCL 50 MG/ML IJ SOLN: 12.5000 mg | INTRAMUSCULAR | Status: DC | PRN

## 2016-05-27 MED ORDER — ONDANSETRON HCL 4 MG/2ML IJ SOLN
INTRAMUSCULAR | Status: DC | PRN
  Administered 2016-05-27: 4 mg via INTRAVENOUS

## 2016-05-27 MED ORDER — COCONUT OIL OIL
1.0000 "application " | TOPICAL_OIL | Status: DC | PRN
  Filled 2016-05-27: qty 120

## 2016-05-27 MED ORDER — NALBUPHINE HCL 10 MG/ML IJ SOLN: 5.0000 mg | INTRAMUSCULAR | Status: DC | PRN

## 2016-05-27 MED ORDER — NALBUPHINE HCL 10 MG/ML IJ SOLN
INTRAMUSCULAR | Status: AC
  Filled 2016-05-27: qty 1

## 2016-05-27 MED ORDER — MORPHINE SULFATE (PF) 4 MG/ML IV SOLN: 1.0000 mg | INTRAVENOUS | Status: DC | PRN

## 2016-05-27 MED ORDER — NALOXONE HCL 2 MG/2ML IJ SOSY
1.0000 ug/kg/h | PREFILLED_SYRINGE | INTRAVENOUS | Status: DC | PRN
  Filled 2016-05-27: qty 2

## 2016-05-27 MED ORDER — KETOROLAC TROMETHAMINE 30 MG/ML IJ SOLN
30.0000 mg | Freq: Four times a day (QID) | INTRAMUSCULAR | Status: AC | PRN
  Administered 2016-05-27 – 2016-05-28 (×3): 30 mg via INTRAVENOUS
  Filled 2016-05-27 (×3): qty 1

## 2016-05-27 MED ORDER — MIDAZOLAM HCL 2 MG/2ML IJ SOLN: 0.5000 mg | Freq: Once | INTRAMUSCULAR | Status: DC | PRN

## 2016-05-27 MED ORDER — DIPHENHYDRAMINE HCL 25 MG PO CAPS
25.0000 mg | ORAL_CAPSULE | Freq: Four times a day (QID) | ORAL | Status: DC | PRN
  Filled 2016-05-27 (×2): qty 1

## 2016-05-27 MED ORDER — OXYCODONE HCL 5 MG PO TABS
10.0000 mg | ORAL_TABLET | ORAL | Status: DC | PRN
  Administered 2016-05-28 – 2016-05-29 (×6): 10 mg via ORAL
  Filled 2016-05-27 (×6): qty 2

## 2016-05-27 MED ORDER — NALBUPHINE HCL 10 MG/ML IJ SOLN
5.0000 mg | INTRAMUSCULAR | Status: DC | PRN
  Administered 2016-05-27: 5 mg via INTRAVENOUS

## 2016-05-27 MED ORDER — OXYTOCIN 10 UNIT/ML IJ SOLN
INTRAVENOUS | Status: DC | PRN
  Administered 2016-05-27: 40 [IU] via INTRAVENOUS

## 2016-05-27 MED ORDER — PHENYLEPHRINE 8 MG IN D5W 100 ML (0.08MG/ML) PREMIX OPTIME
INJECTION | INTRAVENOUS | Status: AC
  Filled 2016-05-27: qty 100

## 2016-05-27 MED ORDER — SENNOSIDES-DOCUSATE SODIUM 8.6-50 MG PO TABS
2.0000 | ORAL_TABLET | ORAL | Status: DC
  Administered 2016-05-27 – 2016-05-28 (×2): 2 via ORAL
  Filled 2016-05-27 (×4): qty 2

## 2016-05-27 SURGICAL SUPPLY — 38 items
BARRIER ADHS 3X4 INTERCEED (GAUZE/BANDAGES/DRESSINGS) ×3 IMPLANT
BENZOIN TINCTURE PRP APPL 2/3 (GAUZE/BANDAGES/DRESSINGS) ×3 IMPLANT
CHLORAPREP W/TINT 26ML (MISCELLANEOUS) ×3 IMPLANT
CLAMP CORD UMBIL (MISCELLANEOUS) IMPLANT
CLOSURE STERI STRIP 1/2 X4 (GAUZE/BANDAGES/DRESSINGS) ×3 IMPLANT
CLOSURE WOUND 1/2 X4 (GAUZE/BANDAGES/DRESSINGS) ×2
CLOTH BEACON ORANGE TIMEOUT ST (SAFETY) ×3 IMPLANT
CONTAINER PREFILL 10% NBF 15ML (MISCELLANEOUS) IMPLANT
DRSG OPSITE POSTOP 4X10 (GAUZE/BANDAGES/DRESSINGS) ×3 IMPLANT
ELECT REM PT RETURN 9FT ADLT (ELECTROSURGICAL) ×3
ELECTRODE REM PT RTRN 9FT ADLT (ELECTROSURGICAL) ×1 IMPLANT
EXTRACTOR VACUUM KIWI (MISCELLANEOUS) IMPLANT
GLOVE BIOGEL M 6.5 STRL (GLOVE) ×6 IMPLANT
GLOVE BIOGEL PI IND STRL 6.5 (GLOVE) ×1 IMPLANT
GLOVE BIOGEL PI IND STRL 7.0 (GLOVE) ×1 IMPLANT
GLOVE BIOGEL PI INDICATOR 6.5 (GLOVE) ×2
GLOVE BIOGEL PI INDICATOR 7.0 (GLOVE) ×2
GOWN STRL REUS W/TWL LRG LVL3 (GOWN DISPOSABLE) ×9 IMPLANT
KIT ABG SYR 3ML LUER SLIP (SYRINGE) IMPLANT
NEEDLE HYPO 25X5/8 SAFETYGLIDE (NEEDLE) IMPLANT
NS IRRIG 1000ML POUR BTL (IV SOLUTION) ×3 IMPLANT
PACK C SECTION WH (CUSTOM PROCEDURE TRAY) ×3 IMPLANT
PAD OB MATERNITY 4.3X12.25 (PERSONAL CARE ITEMS) ×3 IMPLANT
PENCIL SMOKE EVAC W/HOLSTER (ELECTROSURGICAL) ×3 IMPLANT
RTRCTR C-SECT PINK 25CM LRG (MISCELLANEOUS) ×3 IMPLANT
SCISSORS CVD SS 5MM (INSTRUMENTS) ×3 IMPLANT
STRIP CLOSURE SKIN 1/2X4 (GAUZE/BANDAGES/DRESSINGS) ×4 IMPLANT
SUT PDS AB 0 CT1 27 (SUTURE) ×6 IMPLANT
SUT PLAIN 0 NONE (SUTURE) IMPLANT
SUT VIC AB 0 CTX 36 (SUTURE) ×6
SUT VIC AB 0 CTX36XBRD ANBCTRL (SUTURE) ×3 IMPLANT
SUT VIC AB 2-0 CT1 27 (SUTURE) ×2
SUT VIC AB 2-0 CT1 TAPERPNT 27 (SUTURE) ×1 IMPLANT
SUT VIC AB 3-0 SH 27 (SUTURE)
SUT VIC AB 3-0 SH 27X BRD (SUTURE) IMPLANT
SUT VIC AB 4-0 KS 27 (SUTURE) ×3 IMPLANT
TOWEL OR 17X24 6PK STRL BLUE (TOWEL DISPOSABLE) ×3 IMPLANT
TRAY FOLEY CATH SILVER 14FR (SET/KITS/TRAYS/PACK) ×3 IMPLANT

## 2016-05-27 NOTE — Addendum Note (Signed)
Addendum  created 05/27/16 1943 by Elgie CongoNataliya H Darbi Chandran, CRNA   Sign clinical note

## 2016-05-27 NOTE — Anesthesia Postprocedure Evaluation (Signed)
Anesthesia Post Note  Patient: Vanessa Jenkins  Procedure(s) Performed: Procedure(s) (LRB): REPEAT CESAREAN SECTION WITH BILATERAL TUBAL LIGATION (N/A)  Patient location during evaluation: PACU Anesthesia Type: Spinal Level of consciousness: awake and alert, oriented and patient cooperative Pain management: pain level controlled Vital Signs Assessment: post-procedure vital signs reviewed and stable Respiratory status: spontaneous breathing, nonlabored ventilation and respiratory function stable Cardiovascular status: blood pressure returned to baseline and stable Postop Assessment: spinal receding, patient able to bend at knees, no signs of nausea or vomiting and no headache Anesthetic complications: no        Last Vitals:  Vitals:   05/27/16 1100 05/27/16 1120  BP: 100/63 (!) 99/58  Pulse:  68  Resp: 20 20  Temp:  36.8 C    Last Pain:  Vitals:   05/27/16 1120  TempSrc: Oral  PainSc:    Pain Goal: Patients Stated Pain Goal: 8 (05/27/16 0909)               Erling CruzJACKSON,E. Dai Apel

## 2016-05-27 NOTE — Consult Note (Signed)
Neonatology Note:   Attendance at C-section:    I was asked by Dr. Richardson Doppole to attend this repeat C/S at 37 1/7 weeks. The mother is a G3P2 A pos, GBS neg with gestational HTN. ROM at delivery, fluid clear. Infant vigorous with good spontaneous cry and tone. Needed only minimal bulb suctioning. Ap 8/9. Lungs clear to ausc in DR. To CN to care of Pediatrician.   Doretha Souhristie C. Gordy Goar, MD

## 2016-05-27 NOTE — Anesthesia Preprocedure Evaluation (Signed)
Anesthesia Evaluation  Patient identified by MRN, date of birth, ID band Patient awake    Reviewed: Allergy & Precautions, NPO status , Patient's Chart, lab work & pertinent test results  History of Anesthesia Complications Negative for: history of anesthetic complications  Airway Mallampati: III  TM Distance: >3 FB Neck ROM: Full    Dental  (+) Dental Advisory Given   Pulmonary neg pulmonary ROS,    breath sounds clear to auscultation       Cardiovascular hypertension (no meds), negative cardio ROS   Rhythm:Regular Rate:Normal     Neuro/Psych negative neurological ROS     GI/Hepatic Neg liver ROS, GERD  ,  Endo/Other  negative endocrine ROS  Renal/GU negative Renal ROS     Musculoskeletal negative musculoskeletal ROS (+)   Abdominal   Peds  Hematology negative hematology ROS (+) plt 317   Anesthesia Other Findings   Reproductive/Obstetrics (+) Pregnancy                             Anesthesia Physical Anesthesia Plan  ASA: II  Anesthesia Plan: Spinal   Post-op Pain Management:    Induction:   Airway Management Planned: Natural Airway  Additional Equipment:   Intra-op Plan:   Post-operative Plan:   Informed Consent: I have reviewed the patients History and Physical, chart, labs and discussed the procedure including the risks, benefits and alternatives for the proposed anesthesia with the patient or authorized representative who has indicated his/her understanding and acceptance.   Dental advisory given  Plan Discussed with: CRNA and Surgeon  Anesthesia Plan Comments: (Plan routine monitors, SAB)        Anesthesia Quick Evaluation

## 2016-05-27 NOTE — Anesthesia Postprocedure Evaluation (Signed)
Anesthesia Post Note  Patient: Eula Listenshley [place]ne  Procedure(s) Performed: Procedure(s) (LRB): REPEAT CESAREAN SECTION WITH BILATERAL TUBAL LIGATION (N/A)  Patient location during evaluation: Mother Baby Anesthesia Type: Spinal Level of consciousness: awake and alert Pain management: pain level controlled Vital Signs Assessment: post-procedure vital signs reviewed and stable Respiratory status: spontaneous breathing and nonlabored ventilation Cardiovascular status: stable Postop Assessment: no headache, patient able to bend at knees, no backache, no signs of nausea or vomiting, spinal receding and adequate PO intake Anesthetic complications: no        Last Vitals:  Vitals:   05/27/16 1729 05/27/16 1900  BP: (!) 93/48 (!) 100/51  Pulse: 65 73  Resp: 16 18  Temp: 36.7 C 36.8 C    Last Pain:  Vitals:   05/27/16 1729  TempSrc: Oral  PainSc:    Pain Goal: Patients Stated Pain Goal: 3 (05/27/16 1600)               Mariela Rex Hristova

## 2016-05-27 NOTE — Transfer of Care (Signed)
Immediate Anesthesia Transfer of Care Note  Patient: Vanessa Jenkins  Procedure(s) Performed: Procedure(s) with comments: REPEAT CESAREAN SECTION WITH BILATERAL TUBAL LIGATION (N/A) - EDD 06/16/16  Patient Location: PACU  Anesthesia Type:Spinal  Level of Consciousness: awake  Airway & Oxygen Therapy: Patient Spontanous Breathing  Post-op Assessment: Report given to RN and Post -op Vital signs reviewed and stable  Post vital signs: Reviewed and stable  Last Vitals:  Vitals:   05/27/16 0605  BP: 113/74  Pulse: (!) 107  Resp: 16  Temp: 36.8 C    Last Pain:  Vitals:   05/27/16 0605  TempSrc: Oral         Complications: No apparent anesthesia complications

## 2016-05-27 NOTE — Lactation Note (Signed)
This note was copied from a baby's chart. Lactation Consultation Note  Patient Name: [name] UJWJX'BToday's Date: 05/27/2016 Reason for consult: Initial assessment;Other (Comment);Infant < 6lbs (Early term infant ) Baby is 6 hours old and has been to the breast for 50 , 30 and 5 min feeding.  [facility] Cain SaupeMartha Stringer, baby latched well earlier and swallows noted.  When LC walked in the room baby latched on the right breast with depth and flanged lips. Baby noted to be sleepy, sluggish, and non - nutritive. LC pointed the feeding pattern out to  mom and had mom release suction . Nipple well rounded. Per mom baby had fed 5 mins. LC easily expressed off milk. LC encouraged mom to apply back on her nipple .  LC also discussed the importance of depth with the latch and that feeding in the cradle position  Early breast feeding the depth isn't always achieved.  LC also discussed with mom the the importance of STS feedings and STS in between feedings to  Enhance let down.  Since baby has got'en off to a good start breast feeding and mom doing a lot of STS, we have time  To set up the DEBP. LC mentioned to mom if the baby becomes to sleepy to feed , hand expressing  And pumping would be indicated to get the milk coming in quicker. LPT feeding policy given to mom.  Mother informed of post-discharge support and given phone number to the lactation department, including  services for phone call assistance; out-patient appointments; and breastfeeding support group. List of other  breastfeeding resources in the community given in the handout. Encouraged mother to call for problems or  concerns related to breastfeeding.  Maternal Data Has patient been taught Hand Expression?: Yes (after baby was released, large drop noted ) Does the patient have breastfeeding experience prior to this delivery?: Yes  Feeding Feeding Type:  (baby latched with depth, non- nutritive ) Length of feed: 5 min (baby still  latched w/ depth, sleepy/ non - nutritive )  LATCH Score/Interventions Latch: Grasps breast easily, tongue down, lips flanged, rhythmical sucking. Intervention(s): Adjust position;Assist with latch;Breast massage;Breast compression  Audible Swallowing: A few with stimulation Intervention(s): Skin to skin;Hand expression  Type of Nipple: Everted at rest and after stimulation  Comfort (Breast/Nipple): Soft / non-tender     Hold (Positioning): Assistance needed to correctly position infant at breast and maintain latch. Intervention(s): Breastfeeding basics reviewed  LATCH Score: 8  Lactation Tools Discussed/Used Tools:  (see LC note for Pump discussion w/ mom ) WIC Program: No (per mom )   Consult Status Consult Status: Follow-up Date: 05/28/16 Follow-up type: In-patient    Matilde SprangMargaret Ann Mairen Wallenstein 05/27/2016, 2:35 PM

## 2016-05-27 NOTE — Op Note (Signed)
Cesarean Section and Bilateral tubal ligation  Procedure Note  Indications: 37 wks and 1 day with gestational hypertension. history of cesarean section x 2. pt desires repeat cesarean section and bilateral tubal ligation.   Pre-operative Diagnosis: 37 week 1 day pregnancy.  Post-operative Diagnosis: same  Surgeon: Jessee AversOLE,Kosisochukwu Goldberg J.   Assistants: Dr. Geryl RankinsEvelyn Varnado   Anesthesia: Spinal anesthesia  ASA Class: 2   Procedure Details   The patient was seen in the Holding Room. The risks, benefits, complications, treatment options, and expected outcomes were discussed with the patient.  The patient concurred with the proposed plan, giving informed consent.  The site of surgery properly noted/marked. The patient was taken to Operating Room # 9, identified as Eula Listenshley Carstens and the procedure verified as C-Section Delivery. A Time Out was held and the above information confirmed.  After induction of anesthesia, the patient was draped and prepped in the usual sterile manner. A Pfannenstiel incision was made and carried down through the subcutaneous tissue to the fascia. Fascial incision was made and extended transversely. The fascia was separated from the underlying rectus tissue superiorly and inferiorly. The peritoneum was identified and entered. Peritoneal incision was extended longitudinally. The utero-vesical peritoneal reflection was incised transversely and the bladder flap was bluntly freed from the lower uterine segment. A low transverse uterine incision was made. Delivered from cephalic presentation was a   Female with Apgar scores of 8 at one minute and 9 at five minutes. After the umbilical cord was clamped and cut cord blood was obtained for evaluation. The placenta was removed intact and appeared normal. The uterine outline, tubes and ovaries appeared normal. The uterine incision was closed with running locked sutures of O vicryl . Hemostasis was observed. .. Attention was turned to the left  fallopian tube which was grasped with a babcock clamp. An incision was made in the mesosalpinx with the Bovie. A free tie of 0 plain gut was placed medially and laterally. The intervene segment was excised. This was repeated on the right fallopian tube. Lavage was carried out until clear. The fascia was then reapproximated with running sutures of 0 pds. The skin was reapproximated with 4-0 vicryl .  Instrument, sponge, and needle counts were correct prior the abdominal closure and at the conclusion of the case.   Findings: Female infant in the cephalic presentation. Normal fallopian tubes and ovaries.   Estimated Blood Loss:  600 mL         Drains: None         Total IV Fluids:  Per anestheaia          Specimens: Fallopian Tube and Disposition:  Sent to Pathology ... Placenta sent to labor and delivery           Implants: None         Complications:  None; patient tolerated the procedure well.         Disposition: PACU - hemodynamically stable.         Condition: stable  Attending Attestation: I performed the procedure.

## 2016-05-27 NOTE — Anesthesia Procedure Notes (Signed)
Spinal  Patient location during procedure: OR End time: 05/27/2016 7:46 AM Staffing Anesthesiologist: Jairo BenJACKSON, Sandrika Schwinn Performed: anesthesiologist  Preanesthetic Checklist Completed: patient identified, surgical consent, pre-op evaluation, timeout performed, IV checked, risks and benefits discussed and monitors and equipment checked Spinal Block Patient position: sitting Prep: ChloraPrep and site prepped and draped Patient monitoring: blood pressure, continuous pulse ox, cardiac monitor and heart rate Approach: midline Location: L3-4 Injection technique: single-shot Needle Needle type: Quincke  Needle gauge: 25 G Needle length: 9 cm Additional Notes Pt identified in Operating room.  Monitors applied. Working IV access confirmed. Sterile prep, drape lumbar spine.  1% lido local L 3,4.  #25ga Quincke into clear CSF L 3,4.  9mg  0.75% Bupivacaine with dextrose, fentanyl, morphine injected with asp CSF beginning and end of injection.  Patient asymptomatic, VSS, no heme aspirated, tolerated well.  Sandford Craze Jaylean Buenaventura, MD

## 2016-05-27 NOTE — H&P (Signed)
Date of Initial H&P: 05/26/2016 History reviewed, patient examined, no change in status, stable for surgery.

## 2016-05-28 LAB — CBC
HCT: 30.1 % — ABNORMAL LOW (ref 36.0–46.0)
HEMOGLOBIN: 9.9 g/dL — AB (ref 12.0–15.0)
MCH: 27.6 pg (ref 26.0–34.0)
MCHC: 32.9 g/dL (ref 30.0–36.0)
MCV: 83.8 fL (ref 78.0–100.0)
Platelets: 252 10*3/uL (ref 150–400)
RBC: 3.59 MIL/uL — ABNORMAL LOW (ref 3.87–5.11)
RDW: 14.7 % (ref 11.5–15.5)
WBC: 9.3 10*3/uL (ref 4.0–10.5)

## 2016-05-28 LAB — BIRTH TISSUE RECOVERY COLLECTION (PLACENTA DONATION)

## 2016-05-28 NOTE — Progress Notes (Signed)
MOB was referred for history of depression/anxiety. * Referral screened out by Clinical Social Worker because none of the following criteria appear to apply: ~ History of anxiety/depression during this pregnancy, or of post-partum depression. ~ Diagnosis of anxiety and/or depression within last 3 years OR * MOB's symptoms currently being treated with medication and/or therapy. Please contact the Clinical Social Worker if needs arise, or if MOB requests.  MOB has Rx for Buspar.   

## 2016-05-28 NOTE — Progress Notes (Signed)
Subjective: Postpartum Day 1: Cesarean Delivery Patient reports tolerating PO, + flatus and no problems voiding.    Objective: Vital signs in last 24 hours: Temp:  [97.5 F (36.4 C)-98.3 F (36.8 C)] 98.1 F (36.7 C) (12/20 0800) Pulse Rate:  [65-88] 88 (12/20 0800) Resp:  [16-18] 16 (12/20 0800) BP: (91-101)/(46-58) 97/57 (12/20 0800) SpO2:  [97 %-100 %] 98 % (12/20 0800)  Physical Exam:  General: alert and cooperative Lochia: appropriate Uterine Fundus: firm Incision: healing well DVT Evaluation: No evidence of DVT seen on physical exam.   Recent Labs  05/26/16 1155 05/28/16 0549  HGB 11.6* 9.9*  HCT 34.5* 30.1*    Assessment/Plan: Status post Cesarean section. Doing well postoperatively.  Gestational hypertension. BP is normal  Continue current care  patient is interested in early discharge tomorrow.  Plan discharge tomorrow if newborn is ready for discharge. Follow up in the office in 1 week for bp check .  Jaelin Devincentis J. 05/28/2016, 11:50 AM

## 2016-05-28 NOTE — Lactation Note (Signed)
This note was copied from a baby's chart. Lactation Consultation Note  Patient Name: Vanessa Jenkins ZOXWR'UToday's Date: 05/28/2016 Reason for consult: Follow-up assessment;Late preterm infant;Infant < 6lbs  Follow up visit made at 26 hours.  Mom states baby is latching at some feedings but sleepy.  Baby has lost 4% in first day.  Mom just set up with a symphony pump.  Instructed to pump every 2-3 hours for 15 minutes.  Discussed importance of supplementation due to weight loss and low birth weight.  Mom agreeable and states she supplemented with her last baby until her milk came in.  Instructed to give 10-15 mls of expressed milk/formula after breastfeeding every 3 hours using a slow flow nipple.  We talked about the possible use of a nipple shield to stimulate baby's suck.  She used a nipple shield with her last baby.  Mom will call with next feeding cues for feeding assist.  Maternal Data    Feeding    LATCH Score/Interventions                      Lactation Tools Discussed/Used Pump Review: Setup, frequency, and cleaning;Milk Storage Initiated by:: RN Date initiated:: 05/28/16   Consult Status Consult Status: Follow-up Date: 05/29/16 Follow-up type: In-patient    Huston FoleyMOULDEN, Atalie Oros S 05/28/2016, 10:20 AM

## 2016-05-29 ENCOUNTER — Telehealth (HOSPITAL_COMMUNITY): Payer: Self-pay | Admitting: Lactation Services

## 2016-05-29 MED ORDER — IBUPROFEN 600 MG PO TABS: 600.0000 mg | ORAL_TABLET | Freq: Four times a day (QID) | ORAL | 0 refills | Status: DC

## 2016-05-29 MED ORDER — OXYCODONE-ACETAMINOPHEN 5-325 MG PO TABS: 1.0000 | ORAL_TABLET | Freq: Four times a day (QID) | ORAL | 0 refills | Status: AC | PRN

## 2016-05-29 MED ORDER — FERROUS SULFATE 325 (65 FE) MG PO TABS: 325.0000 mg | ORAL_TABLET | Freq: Every day | ORAL | 3 refills | Status: DC

## 2016-05-29 MED ORDER — DOCUSATE SODIUM 100 MG PO CAPS: 100.0000 mg | ORAL_CAPSULE | Freq: Two times a day (BID) | ORAL | 0 refills | Status: DC

## 2016-05-29 NOTE — Discharge Instructions (Signed)
Cesarean Delivery, Care After Refer to this sheet in the next few weeks. These instructions provide you with information about caring for yourself after your procedure. Your health care provider may also give you more specific instructions. Your treatment has been planned according to current medical practices, but problems sometimes occur. Call your health care provider if you have any problems or questions after your procedure. What can I expect after the procedure? After the procedure, it is common to have:  A small amount of blood or clear fluid coming from the incision.  Some redness, swelling, and pain in your incision area.  Some abdominal pain and soreness.  Vaginal bleeding (lochia).  Pelvic cramps.  Fatigue. Follow these instructions at home: Incision care  Follow instructions from your health care provider about how to take care of your incision. Make sure you:    Check your incision area every day for signs of infection. Check for:  More redness, swelling, or pain.  More fluid or blood.  Warmth.  Pus or a bad smell.  When you cough or sneeze, hug a pillow. This helps with pain and decreases the chance of your incision opening up (dehiscing). Do this until your incision heals. Medicines  Take over-the-counter and prescription medicines only as told by your health care provider.  If you were prescribed an antibiotic medicine, take it as told by your health care provider. Do not stop taking the antibiotic until it is finished. Driving  Do not drive or operate heavy machinery while taking prescription pain medicine.  For pain management you may alternate between the Percocet and Motrin.  If taking the percocet, this may cause constipation, please take colace twice daily as needed.  Do not drive for 24 hours if you received a sedative. Lifestyle  Do not drink alcohol. This is especially important if you are breastfeeding or taking pain medicine.  Do not use  tobacco products, including cigarettes, chewing tobacco, or e-cigarettes. If you need help quitting, ask your health care provider. Tobacco can delay wound healing. Eating and drinking  Drink at least 8 eight-ounce glasses of water every day unless told not to by your health care provider. If you breastfeed, you may need to drink more water than this.  Eat high-fiber foods every day. These foods may help prevent or relieve constipation. High-fiber foods include:  Whole grain cereals and breads.  Brown rice.  Beans.  Fresh fruits and vegetables. Activity  Return to your normal activities as told by your health care provider. Ask your health care provider what activities are safe for you.  Rest as much as possible. Try to rest or take a nap while your baby is sleeping.  Do not lift anything that is heavier than your baby or 10 lb (4.5 kg) as told by your health care provider.  Ask your health care provider when you can engage in sexual activity. This may depend on your:  Risk of infection.  Healing rate.  Comfort and desire to engage in sexual activity. Bathing  Do not take baths, swim, or use a hot tub until your health care provider approves. Ask your health care provider if you can take showers. You may only be allowed to take sponge baths until your incision heals.  Keep your dressing dry as told by your health care provider. General instructions  Do not use tampons or douches until your health care provider approves.  Wear:  Loose, comfortable clothing.  A supportive and well-fitting bra.  Watch  for any blood clots that may pass from your vagina. These may look like clumps of dark red, brown, or black discharge.  Keep your perineum clean and dry as told by your health care provider.  Wipe from front to back when you use the toilet.  If possible, have someone help you care for your baby and help with household activities for a few days after you leave the  hospital.  Keep all follow-up visits for you and your baby as told by your health care provider. This is important. Contact a health care provider if:  You have:  Bad-smelling vaginal discharge.  Difficulty urinating.  Pain when urinating.  A sudden increase or decrease in the frequency of your bowel movements.  More redness, swelling, or pain around your incision.  More fluid or blood coming from your incision.  Pus or a bad smell coming from your incision.  A fever.  A rash.  Little or no interest in activities you used to enjoy.  Questions about caring for yourself or your baby.  Nausea.  Your incision feels warm to the touch.  Your breasts turn red or become painful or hard.  You feel unusually sad or worried.  You vomit.  You pass large blood clots from your vagina. If you pass a blood clot, save it to show to your health care provider. Do not flush blood clots down the toilet without showing your health care provider.  You urinate more than usual.  You are dizzy or light-headed.  You have not breastfed and have not had a menstrual period for 12 weeks after delivery.  You stopped breastfeeding and have not had a menstrual period for 12 weeks after stopping breastfeeding. Get help right away if:  You have:  Pain that does not go away or get better with medicine.  Chest pain.  Difficulty breathing.  Blurred vision or spots in your vision.  Thoughts about hurting yourself or your baby.  New pain in your abdomen or in one of your legs.  A severe headache.  You faint.  You bleed from your vagina so much that you fill two sanitary pads in one hour. This information is not intended to replace advice given to you by your health care provider. Make sure you discuss any questions you have with your health care provider. Document Released: 02/15/2002 Document Revised: 10/04/2015 Document Reviewed: 04/30/2015 Elsevier Interactive Patient Education   2017 [employer]

## 2016-05-29 NOTE — Telephone Encounter (Signed)
Baby discharged, using nipple shield--without LC visit prior to discharge--so called for lactation follow-up. Mom states that she is becoming engorged and that this did not happen with first two children. Discussed engorgement prevention/treatment. Mom states that she wants to nurse this baby, but baby is not latching well. Discussed how full breast make it harder for baby to latch. Mom states that she was pumping 10 ml of EBM at the hospital, but then her milk has increased today to 8 ounces from each breast with each pumping. Mom states that she had this amount when she pumped with her first child, born at 7034 weeks, but then she suddenly stopped producing milk. Discussed pre-pumping to remove the less fatty milk and soften breasts so that the baby can latch more easily. Enc mom to nurse with cues and at least every 3 hours, waking the baby as needed. Enc mom to supplement with the hind/fatty milk, giving at least 30 ml each feeding for now. Mom reports that the baby took 30 ml at the last feeding and was satisfied.  Discussed with mom that NS is a temporary device, and enc attempting to latch without the shield after baby nursing for a few minutes using the shield. Enc nursing STS since baby sleepy at the breast. Discussed the need to keep pumping while using NS to protect milk supply, and the need to keep supplementing the baby to make sure she is getting enough at each feeding. Mom reports that she will probably be returning to work at the end of January--but she is not sure yet. Offered to make mom a follow-up appointment, but mom declined stating that she will be out of town. Mom stated that she will [facility] when she returns home. Mom aware that she can call for assistance as needed.

## 2016-05-29 NOTE — Progress Notes (Signed)
Called to pt room. Pt complaining of itching. Fine slightly red raised rash noted on abdomen and back. Benadryl given.

## 2016-05-29 NOTE — Progress Notes (Signed)
Subjective: Postpartum Day 2: Cesarean Delivery Patient resting comfortably in bed.  No F/C/CP/SOB.  Tolerating gen diet.  +flatus, no BM.  Moderate lochia.  She has noted a rash on her abdomen, itching improved with Benadryl.  Breastfeeding without difficulty.   Objective: Vital signs in last 24 hours: Temp:  [98.1 F (36.7 C)-98.4 F (36.9 C)] 98.4 F (36.9 C) (12/21 0530) Pulse Rate:  [73-88] 73 (12/21 0530) Resp:  [16-18] 18 (12/21 0530) BP: (97-111)/(52-66) 109/52 (12/21 0530) SpO2:  [98 %] 98 % (12/20 0800)  Physical Exam:  General: alert and cooperative  CV: RRR Lungs:CTAB Lochia: appropriate Uterine Fundus: firm, below umbilicus Incision: healing well- C/D/I with honey comb Faint diffuse pink rash noted on lower portion of abdomen DVT Evaluation: No evidence of DVT seen on physical exam.   Recent Labs  05/26/16 1155 05/28/16 0549  HGB 11.6* 9.9*  HCT 34.5* 30.1*    Assessment/Plan: 25yo [id number] s/p Repeat C-section and tubal ligation, POD#2 -Gestational hypertension. BP within normal limits, no medication -Abdominal rash: ok to continue with benadryl prn -Meeting postoperative milestones appropriately -Continue with routine post-C-section care   Plan discharge for discharge home today, if newborn is ready for discharge. Follow up in the office in 1 week for bp check .  Vanessa HidalgoZAN, Vanessa Jenkins, M 05/29/2016, 6:41 AM

## 2016-05-30 NOTE — Discharge Summary (Signed)
OB Discharge Summary     Patient Name: Vanessa Jenkins DOB: 01/28/1991 MRN: [id number]  Date of admission: 05/27/2016 Delivering MD: Gerald LeitzOLE, TARA   Date of discharge: 05/29/2016  Admitting diagnosis: Z98.891 HO C-Section Intrauterine pregnancy: 10263w1d     Secondary diagnosis:  Active Problems:   S/P cesarean section  Additional problems: Gestational HTN     Discharge diagnosis: Term Pregnancy Delivered and gestational HTN                                                                                                Post partum procedures:none  Augmentation: N/A  Complications: None  Hospital course:  Sceduled C/S   25 y.o. yo G3P2103 at 1363w1d was admitted to the hospital 05/27/2016 for scheduled cesarean section with the following indication:Prior Uterine Surgery- history of C-section x 2 Membrane Rupture Time/Date: 8:12 AM ,05/27/2016   Patient delivered a Viable infant.05/27/2016  Details of operation can be found in separate operative note.  Pateint had an uncomplicated postpartum course.  She is ambulating, tolerating a regular diet, passing flatus, and urinating well. Patient is discharged home in stable condition on  05/30/16          Physical exam Vitals:   05/28/16 0400 05/28/16 0800 05/28/16 1800 05/29/16 0530  BP: (!) 101/58 (!) 97/57 111/66 (!) 109/52  Pulse: 78 88 73 73  Resp: 18 16 18 18   Temp: 98.3 F (36.8 C) 98.1 F (36.7 C) 98.3 F (36.8 C) 98.4 F (36.9 C)  TempSrc:  Oral Oral Oral  SpO2: 97% 98%     General: alert, cooperative and no distress Lochia: appropriate Uterine Fundus: firm Incision: Dressing is clean, dry, and intact DVT Evaluation: No evidence of DVT seen on physical exam. Labs: Lab Results  Component Value Date   WBC 9.3 05/28/2016   HGB 9.9 (L) 05/28/2016   HCT 30.1 (L) 05/28/2016   MCV 83.8 05/28/2016   PLT 252 05/28/2016   CMP Latest Ref Rng & Units 05/26/2016  Glucose 65 - 99 mg/dL 94  BUN 6 - 20 mg/dL 7  Creatinine 4.090.44 -  8.111.00 mg/dL 9.14(N0.41(L)  Sodium 829135 - 562145 mmol/L 136  Potassium 3.5 - 5.1 mmol/L 3.7  Chloride 101 - 111 mmol/L 105  CO2 22 - 32 mmol/L 23  Calcium 8.9 - 10.3 mg/dL 1.3(Y8.6(L)  Total Protein 6.5 - 8.1 g/dL -  Total Bilirubin 0.3 - 1.2 mg/dL -  Alkaline Phos 38 - 865126 U/L -  AST 15 - 41 U/L -  ALT 14 - 54 U/L -    Discharge instruction: per After Visit Summary and "Baby and Me Booklet".  After visit meds:  Allergies as of 05/29/2016      Reactions   Sulfa Antibiotics Swelling, Rash   Shellfish Allergy Swelling, Rash   Sodium Citrate Rash      Medication List    STOP taking these medications   butalbital-acetaminophen-caffeine 50-325-40 MG tablet Commonly known as:  FIORICET, ESGIC   NIFEdipine 10 MG capsule Commonly known as:  PROCARDIA   ondansetron 4 MG disintegrating tablet Commonly known as:  ZOFRAN ODT     TAKE these medications   busPIRone 5 MG tablet Commonly known as:  BUSPAR Take 5 mg by mouth 2 (two) times daily.   docusate sodium 100 MG capsule Commonly known as:  COLACE Take 1 capsule (100 mg total) by mouth 2 (two) times daily.   ferrous sulfate 325 (65 FE) MG tablet Take 1 tablet (325 mg total) by mouth daily with breakfast.   ibuprofen 600 MG tablet Commonly known as:  ADVIL,MOTRIN Take 1 tablet (600 mg total) by mouth every 6 (six) hours.   oxyCODONE-acetaminophen 5-325 MG tablet Commonly known as:  ROXICET Take 1 tablet by mouth every 6 (six) hours as needed for severe pain.   pantoprazole 40 MG tablet Commonly known as:  PROTONIX Take 1 tablet (40 mg total) by mouth daily.   prenatal multivitamin Tabs tablet Take 1 tablet by mouth daily at 12 noon.       Diet: routine diet  Activity: Advance as tolerated. Pelvic rest for 6 weeks.   Outpatient follow up:2 weeks Follow up Appt:No future appointments. Follow up Visit:No Follow-up on file.  Postpartum contraception: Tubal Ligation  Newborn Data: Live born female  Birth Weight: 5 lb 5 oz  (2410 g) APGAR: 8, 9  Baby Feeding: Breast Disposition:home with mother   05/30/2016 Myna HidalgoZAN, Thais Silberstein, M, DO

## 2016-08-12 ENCOUNTER — Encounter (HOSPITAL_COMMUNITY): Payer: Self-pay

## 2016-08-12 ENCOUNTER — Inpatient Hospital Stay (HOSPITAL_COMMUNITY): Payer: Medicaid Other

## 2016-08-12 ENCOUNTER — Inpatient Hospital Stay (HOSPITAL_COMMUNITY)
Admission: AD | Admit: 2016-08-12 | Discharge: 2016-08-12 | Disposition: A | Discharge status: Home / Self Care | Payer: Medicaid Other | Source: Ambulatory Visit | Attending: Obstetrics and Gynecology | Admitting: Obstetrics and Gynecology

## 2016-08-12 DIAGNOSIS — R109 Unspecified abdominal pain: Secondary | ICD-10-CM | POA: Diagnosis not present

## 2016-08-12 DIAGNOSIS — R110 Nausea: Secondary | ICD-10-CM | POA: Diagnosis not present

## 2016-08-12 DIAGNOSIS — N939 Abnormal uterine and vaginal bleeding, unspecified: Secondary | ICD-10-CM | POA: Diagnosis not present

## 2016-08-12 LAB — CBC
HEMATOCRIT: 42.8 % (ref 36.0–46.0)
Hemoglobin: 13.9 g/dL (ref 12.0–15.0)
MCH: 27.4 pg (ref 26.0–34.0)
MCHC: 32.5 g/dL (ref 30.0–36.0)
MCV: 84.3 fL (ref 78.0–100.0)
PLATELETS: 395 10*3/uL (ref 150–400)
RBC: 5.08 MIL/uL (ref 3.87–5.11)
RDW: 15.9 % — AB (ref 11.5–15.5)
WBC: 8.2 10*3/uL (ref 4.0–10.5)

## 2016-08-12 LAB — URINALYSIS, ROUTINE W REFLEX MICROSCOPIC
BILIRUBIN URINE: NEGATIVE
Glucose, UA: NEGATIVE mg/dL
Ketones, ur: NEGATIVE mg/dL
Leukocytes, UA: NEGATIVE
Nitrite: NEGATIVE
PROTEIN: NEGATIVE mg/dL
SPECIFIC GRAVITY, URINE: 1.004 — AB (ref 1.005–1.030)
pH: 7 (ref 5.0–8.0)

## 2016-08-12 LAB — POCT PREGNANCY, URINE: Preg Test, Ur: NEGATIVE

## 2016-08-12 MED ORDER — ONDANSETRON 4 MG PO TBDP
4.0000 mg | ORAL_TABLET | Freq: Once | ORAL | Status: AC
  Administered 2016-08-12: 4 mg via ORAL
  Filled 2016-08-12: qty 1

## 2016-08-12 MED ORDER — IBUPROFEN 600 MG PO TABS: 600.0000 mg | ORAL_TABLET | Freq: Four times a day (QID) | ORAL | 3 refills | Status: AC | PRN

## 2016-08-12 MED ORDER — OXYCODONE-ACETAMINOPHEN 5-325 MG PO TABS
1.0000 | ORAL_TABLET | Freq: Four times a day (QID) | ORAL | Status: DC | PRN
  Administered 2016-08-12: 2 via ORAL
  Filled 2016-08-12: qty 2

## 2016-08-12 NOTE — Discharge Instructions (Signed)
°-  Birth control pills -- take 2 tablets daily until current pack is finished -Ibuprofen -- take on schedule until follow up with Dr. Richardson Doppole -Tylenol -- take between ibuprofen doses as needed for pain coverage -- do not exceed 3600 mg per day      Abnormal Uterine Bleeding Abnormal uterine bleeding can affect women at various stages in life, including teenagers, women in their reproductive years, pregnant women, and women who have reached menopause. Several kinds of uterine bleeding are considered abnormal, including:  Bleeding or spotting between periods.  Bleeding after sexual intercourse.  Bleeding that is heavier or more than normal.  Periods that last longer than usual.  Bleeding after menopause. Many cases of abnormal uterine bleeding are minor and simple to treat, while others are more serious. Any type of abnormal bleeding should be evaluated by your health care provider. Treatment will depend on the cause of the bleeding. Follow these instructions at home: Monitor your condition for any changes. The following actions may help to alleviate any discomfort you are experiencing:  Avoid the use of tampons and douches as directed by your health care provider.  Change your pads frequently. You should get regular pelvic exams and Pap tests. Keep all follow-up appointments for diagnostic tests as directed by your health care provider. Contact a health care provider if:  Your bleeding lasts more than 1 week.  You feel dizzy at times. Get help right away if:  You pass out.  You are changing pads every 15 to 30 minutes.  You have abdominal pain.  You have a fever.  You become sweaty or weak.  You are passing large blood clots from the vagina.  You start to feel nauseous and vomit. This information is not intended to replace advice given to you by your health care provider. Make sure you discuss any questions you have with your health care provider. Document Released:  05/26/2005 Document Revised: 11/07/2015 Document Reviewed: 12/23/2012 Elsevier Interactive Patient Education  2017 [employer]

## 2016-08-12 NOTE — MAU Provider Note (Signed)
History     CSN: [id number]  Arrival date and time: 08/12/16 1050   First Provider Initiated Contact with Patient 08/12/16 1406      Chief Complaint  Patient presents with  . Vaginal Bleeding   Vanessa Jenkins is a 26 y.o. [phone] at 3 months post Cesarean section/BTL  presenting with vaginal bleeding and abdominal cramping. The blood is bright red and heavier than a period. The blood soaks through her clothes at times.  Saturated 2 pads in 1 hr today. She states she has bleeding nonstop since surgery, but bleeding waxes and wanes. Feels weak and dizzy on standing. At her 6 wk postpartum visit she was prescribed OCPs (does not know type) to control the bleeding without improvement. She is taking it as directed and is now in middle of a pill pack.  Unaware of elevated BP in past.    Vaginal Bleeding  Associated symptoms include abdominal pain. Pertinent negatives include no chills or fever. Associated symptoms comments: Suprapubic cramps worse than menses. She has tried NSAIDs (ibuprofen 600gm ) for the symptoms. The treatment provided no relief.    OB History  Gravida Para Term Preterm AB Living  3 3 2 1   3   SAB TAB Ectopic Multiple Live Births        0 1    # Outcome Date GA Lbr Len/2nd Weight Sex Delivery Anes PTL Lv  3 Term 05/27/16 [redacted]w[redacted]d  2.41 kg (5 lb 5 oz) F CS-LTranv Spinal  LIV     Birth Comments: WNL  2 Term  [redacted]w[redacted]d    CS-LTranv     1 Preterm  [redacted]w[redacted]d    CS-Unspec          Past Medical History:  Diagnosis Date  . Anxiety   . Pregnancy induced hypertension   . Preterm labor     Past Surgical History:  Procedure Laterality Date  . CESAREAN SECTION    . CESAREAN SECTION WITH BILATERAL TUBAL LIGATION N/A 05/27/2016   Procedure: REPEAT CESAREAN SECTION WITH BILATERAL TUBAL LIGATION;  Surgeon: Gerald Leitz, MD;  Location: [facility];  Service: Obstetrics;  Laterality: N/A;  EDD 06/16/16    Family History  Problem Relation Age of Onset  . Diabetes Mother   .  Diabetes Father   . Diabetes Maternal Grandmother   . Diabetes Maternal Grandfather   . Diabetes Paternal Grandmother   . Diabetes Paternal Grandfather     Social History  Substance Use Topics  . Smoking status: Never Smoker  . Smokeless tobacco: Never Used  . Alcohol use No    Allergies:  Allergies  Allergen Reactions  . Sulfa Antibiotics Swelling and Rash  . Shellfish Allergy Swelling and Rash  . Sodium Citrate Rash    Prescriptions Prior to Admission  Medication Sig Dispense Refill Last Dose  . ibuprofen (ADVIL,MOTRIN) 600 MG tablet Take 1 tablet (600 mg total) by mouth every 6 (six) hours. 30 tablet 0 Past Week at Unknown time  . docusate sodium (COLACE) 100 MG capsule Take 1 capsule (100 mg total) by mouth 2 (two) times daily. (Patient not taking: Reported on 08/12/2016) 30 capsule 0 Completed Course at Unknown time  . ferrous sulfate 325 (65 FE) MG tablet Take 1 tablet (325 mg total) by mouth daily with breakfast. (Patient not taking: Reported on 08/12/2016) 30 tablet 3 Completed Course at Unknown time  . pantoprazole (PROTONIX) 40 MG tablet Take 1 tablet (40 mg total) by mouth daily. (Patient not taking: Reported  on 05/13/2016) 30 tablet 0 Not Taking at Unknown time    Review of Systems  Constitutional: Positive for fatigue. Negative for chills and fever.  Cardiovascular: Negative for palpitations.  Gastrointestinal: Positive for abdominal pain.  Genitourinary: Positive for vaginal bleeding.  Neurological: Positive for dizziness. Negative for syncope.   Physical Exam   Blood pressure 126/66, pulse 91, temperature 98.2 F (36.8 C), resp. rate 18, SpO2 98 %, unknown if currently breastfeeding. Orthostatic VS for the past 24 hrs:  BP- Lying Pulse- Lying BP- Sitting Pulse- Sitting BP- Standing at 0 minutes Pulse- Standing at 0 minutes  08/12/16 1433 - - - - (!) 133/92 119  08/12/16 1432 - - (!) 142/94 102 - -  08/12/16 1430 119/84 92 - - - -     Physical Exam   Constitutional: She is oriented to person, place, and time. She appears well-developed and well-nourished. She appears distressed.  Tearful  HENT:  Head: Normocephalic.  Eyes: Pupils are equal, round, and reactive to light.  Neck: Normal range of motion.  GI: Soft. There is tenderness.  Moderately tender mid lower abdomen  Genitourinary:  Genitourinary Comments: Speculum: NEFG, vagina with moderate red blood in vault, swabbed and no active bleeding noted; cx clean, nulliparous Uterus  ULNS, slightly tender Adnexae: without tenderness or masses  Musculoskeletal: Normal range of motion.  Neurological: She is alert and oriented to person, place, and time.    MAU Course  Procedures Results for orders placed or performed during the hospital encounter of 08/12/16 (from the past 24 hour(s))  Urinalysis, Routine w reflex microscopic     Status: Abnormal   Collection Time: 08/12/16 11:47 AM  Result Value Ref Range   Color, Urine YELLOW YELLOW   APPearance CLEAR CLEAR   Specific Gravity, Urine 1.004 (L) 1.005 - 1.030   pH 7.0 5.0 - 8.0   Glucose, UA NEGATIVE NEGATIVE mg/dL   Hgb urine dipstick LARGE (A) NEGATIVE   Bilirubin Urine NEGATIVE NEGATIVE   Ketones, ur NEGATIVE NEGATIVE mg/dL   Protein, ur NEGATIVE NEGATIVE mg/dL   Nitrite NEGATIVE NEGATIVE   Leukocytes, UA NEGATIVE NEGATIVE   RBC / HPF TOO NUMEROUS TO COUNT 0 - 5 RBC/hpf   WBC, UA 6-30 0 - 5 WBC/hpf   Bacteria, UA RARE (A) NONE SEEN   Squamous Epithelial / LPF 0-5 (A) NONE SEEN  Pregnancy, urine POC     Status: None   Collection Time: 08/12/16 11:57 AM  Result Value Ref Range   Preg Test, Ur NEGATIVE NEGATIVE  CBC     Status: Abnormal   Collection Time: 08/12/16  2:37 PM  Result Value Ref Range   WBC 8.2 4.0 - 10.5 K/uL   RBC 5.08 3.87 - 5.11 MIL/uL   Hemoglobin 13.9 12.0 - 15.0 g/dL   HCT 16.1 09.6 - 04.5 %   MCV 84.3 78.0 - 100.0 fL   MCH 27.4 26.0 - 34.0 pg   MCHC 32.5 30.0 - 36.0 g/dL   RDW 40.9 (H) 81.1 -  15.5 %   Platelets 395 150 - 400 K/uL   Results for orders placed or performed during the hospital encounter of 08/12/16 (from the past 24 hour(s))  Urinalysis, Routine w reflex microscopic     Status: Abnormal   Collection Time: 08/12/16 11:47 AM  Result Value Ref Range   Color, Urine YELLOW YELLOW   APPearance CLEAR CLEAR   Specific Gravity, Urine 1.004 (L) 1.005 - 1.030   pH 7.0 5.0 - 8.0  Glucose, UA NEGATIVE NEGATIVE mg/dL   Hgb urine dipstick LARGE (A) NEGATIVE   Bilirubin Urine NEGATIVE NEGATIVE   Ketones, ur NEGATIVE NEGATIVE mg/dL   Protein, ur NEGATIVE NEGATIVE mg/dL   Nitrite NEGATIVE NEGATIVE   Leukocytes, UA NEGATIVE NEGATIVE   RBC / HPF TOO NUMEROUS TO COUNT 0 - 5 RBC/hpf   WBC, UA 6-30 0 - 5 WBC/hpf   Bacteria, UA RARE (A) NONE SEEN   Squamous Epithelial / LPF 0-5 (A) NONE SEEN  Pregnancy, urine POC     Status: None   Collection Time: 08/12/16 11:57 AM  Result Value Ref Range   Preg Test, Ur NEGATIVE NEGATIVE  CBC     Status: Abnormal   Collection Time: 08/12/16  2:37 PM  Result Value Ref Range   WBC 8.2 4.0 - 10.5 K/uL   RBC 5.08 3.87 - 5.11 MIL/uL   Hemoglobin 13.9 12.0 - 15.0 g/dL   HCT 40.9 81.1 - 91.4 %   MCV 84.3 78.0 - 100.0 fL   MCH 27.4 26.0 - 34.0 pg   MCHC 32.5 30.0 - 36.0 g/dL   RDW 78.2 (H) 95.6 - 21.3 %   Platelets 395 150 - 400 K/uL   US Transvaginal Non-ob  Result Date: 08/12/2016 CLINICAL DATA:  26 y/o F; vaginal bleeding and lower abdominal pain. EXAM: TRANSABDOMINAL AND TRANSVAGINAL ULTRASOUND OF PELVIS TECHNIQUE: Both transabdominal and transvaginal ultrasound examinations of the pelvis were performed. Transabdominal technique was performed for global imaging of the pelvis including uterus, ovaries, adnexal regions, and pelvic cul-de-sac. It was necessary to proceed with endovaginal exam following the transabdominal exam to visualize the endometrium. COMPARISON:  02/11/2016 pelvic ultrasound. FINDINGS: Uterus Measurements: 8.7 x 4.3 x 6.2  cm. No fibroids or other mass visualized. Endometrium Thickness: 4 mm. Trace fluid in the lower uterine segment. Anterior cleft in the endometrial cavity of lower uterine segment probably related to prior C-section. Right ovary Measurements: 3.7 x 1.6 x 2.0 cm. Normal appearance/no adnexal mass. Left ovary Measurements: 2.8 x 1.5 x 1.7 cm. Normal appearance/no adnexal mass. Other findings No abnormal free fluid. IMPRESSION: 1. No acute process or mass identified. 2. Trace fluid in the lower uterine segment. No significant endometrial thickening. Electronically Signed   By: Mitzi Hansen M.D.   On: 08/12/2016 17:41   US Pelvis Complete  Result Date: 08/12/2016 CLINICAL DATA:  26 y/o F; vaginal bleeding and lower abdominal pain. EXAM: TRANSABDOMINAL AND TRANSVAGINAL ULTRASOUND OF PELVIS TECHNIQUE: Both transabdominal and transvaginal ultrasound examinations of the pelvis were performed. Transabdominal technique was performed for global imaging of the pelvis including uterus, ovaries, adnexal regions, and pelvic cul-de-sac. It was necessary to proceed with endovaginal exam following the transabdominal exam to visualize the endometrium. COMPARISON:  02/11/2016 pelvic ultrasound. FINDINGS: Uterus Measurements: 8.7 x 4.3 x 6.2 cm. No fibroids or other mass visualized. Endometrium Thickness: 4 mm. Trace fluid in the lower uterine segment. Anterior cleft in the endometrial cavity of lower uterine segment probably related to prior C-section. Right ovary Measurements: 3.7 x 1.6 x 2.0 cm. Normal appearance/no adnexal mass. Left ovary Measurements: 2.8 x 1.5 x 1.7 cm. Normal appearance/no adnexal mass. Other findings No abnormal free fluid. IMPRESSION: 1. No acute process or mass identified. 2. Trace fluid in the lower uterine segment. No significant endometrial thickening. Electronically Signed   By: Mitzi Hansen M.D.   On: 08/12/2016 17:41     Percocet 5/325 ii given with relief Zofran 4mg  po  for nausea> repeated at 1700  MDM C/W Dr. Richardson Doppole> will obtain pelvic US  Care assumed by Judeth HornErin Donzella Carrol, NP at 1715 Assessment and Plan  Abnormal vaginal bleeding  Follow-up Information    Jessee AversOLE,TARA J., MD. Schedule an appointment as soon as possible for a visit in 2 week(s).   Specialty:  Obstetrics and Gynecology Contact information: [address] [place] [address] [phone]           Deirdre Poe CNM 08/12/2016, 2:07 PM      S/w Dr. Richardson Doppole regarding ultrasound results. Will discharge home. Double up on OCPs until pack finished & schedule f/u appointment with Dr. Richardson Doppole.   A: 1. Abnormal vaginal bleeding   2. Abdominal cramping    P: Discharge home Rx refill for ibuprofen 600 mg -- discussed taking on a schedule until follow up with Dr. Richardson Doppole. Can use OTC tylenol in addition to ibuprofen Continue OCPs -- take 2 pills per day for remainder of current pack Discussed reasons to return to MAU Call office to schedule f/u appointment with Dr. Richardson Doppole Work note provided to patient  Judeth HornErin Areon Cocuzza, NP

## 2016-08-12 NOTE — MAU Note (Signed)
Pt presents to MAU with complaints of vaginal bleeding since she delivered on Dec 19th. Pt did have a tubal ligation. Lower abdominal pain

## 2017-12-20 IMAGING — US US MFM OB TRANSVAGINAL
1 series · 15 of 17 positions shown · non-contrast
Comparison: none

[Series 1: us mfm ob transvaginal · 15 of 17 slices shown]
[im 1/17]
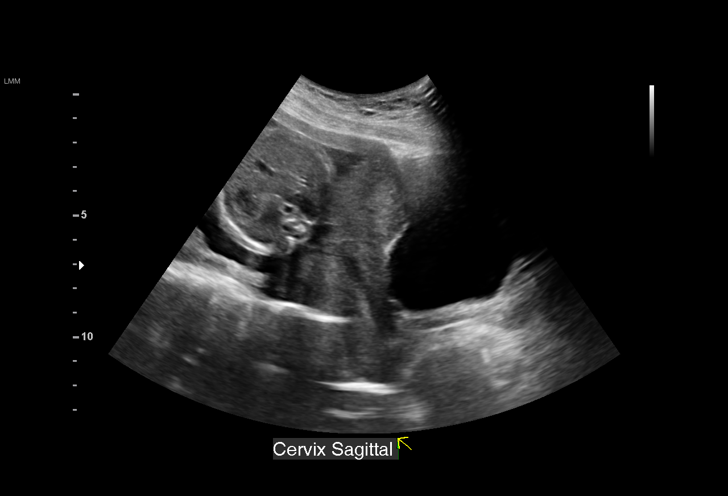
[im 2/17]
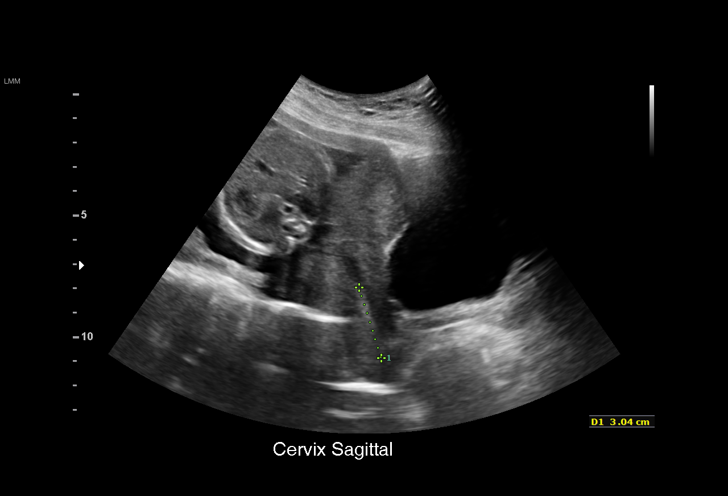
[im 3/17]
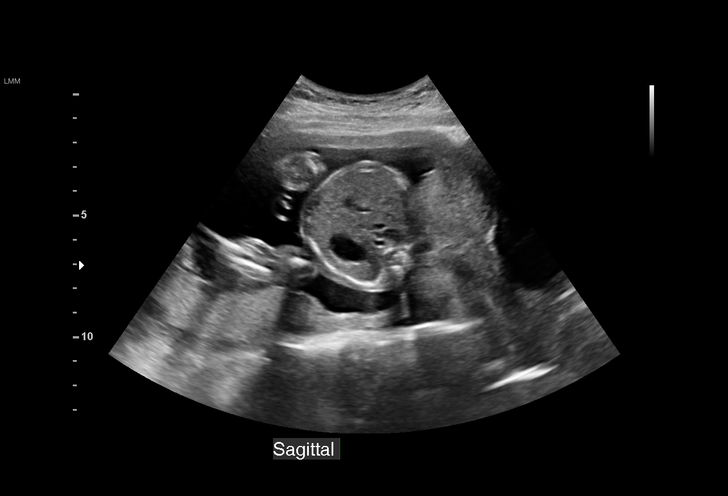
[im 4/17]
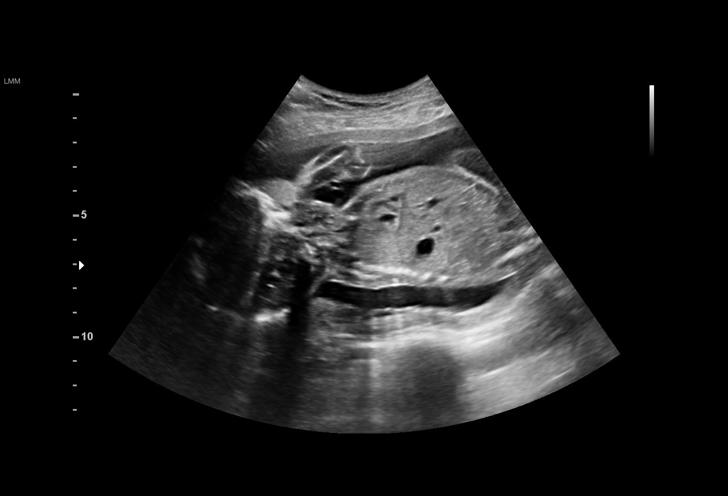
[im 6/17]
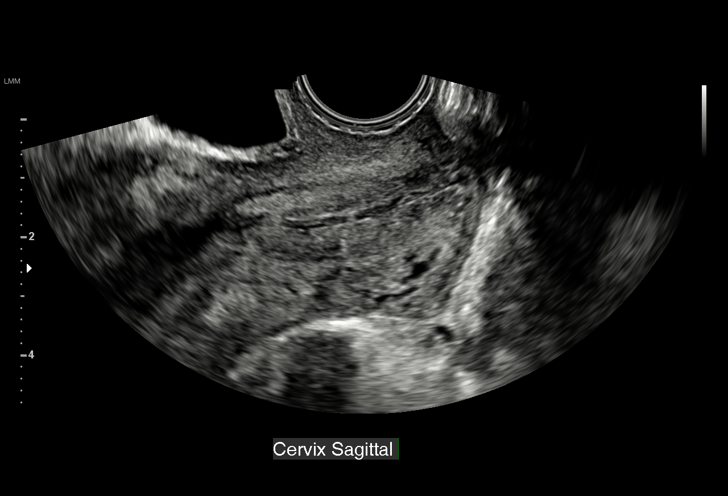
[im 7/17]
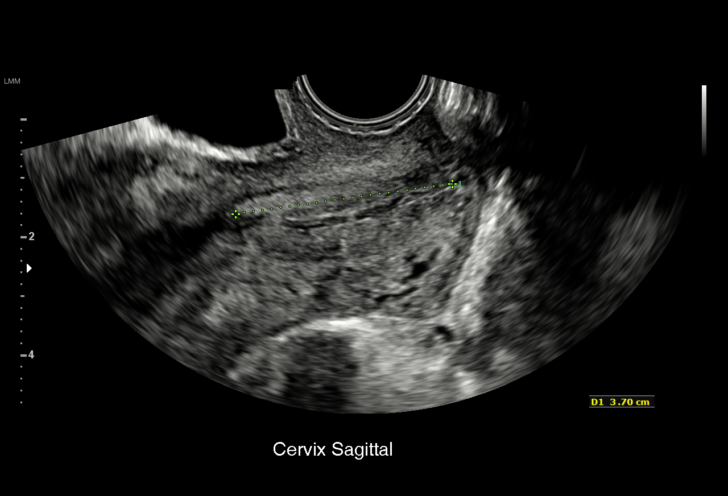
[im 8/17]
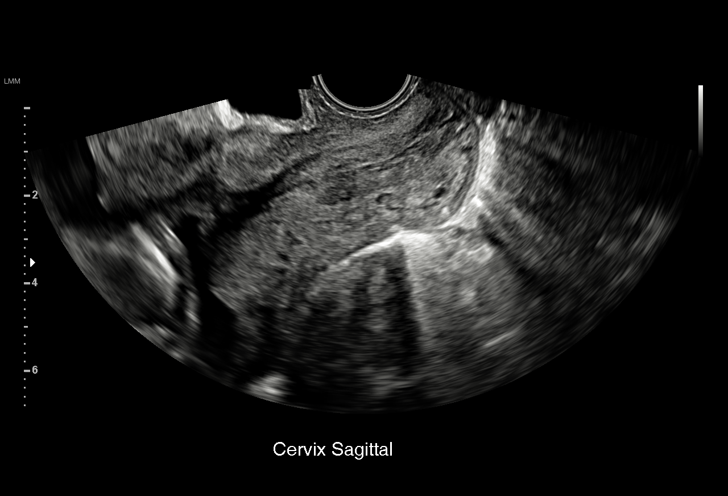
[im 9/17]
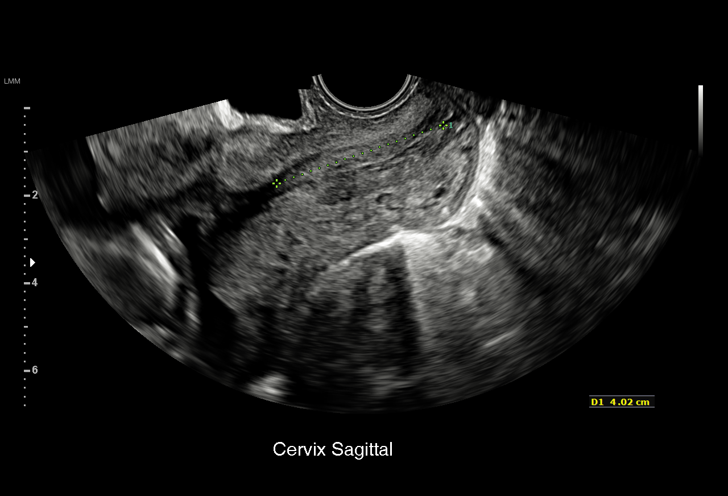
[im 10/17]
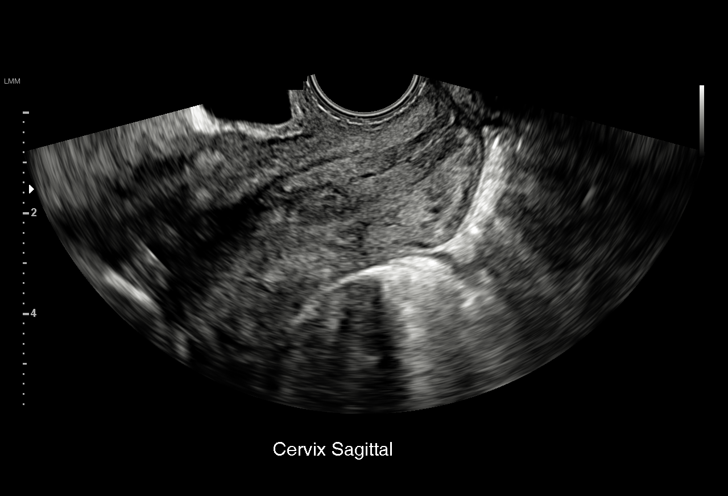
[im 11/17]
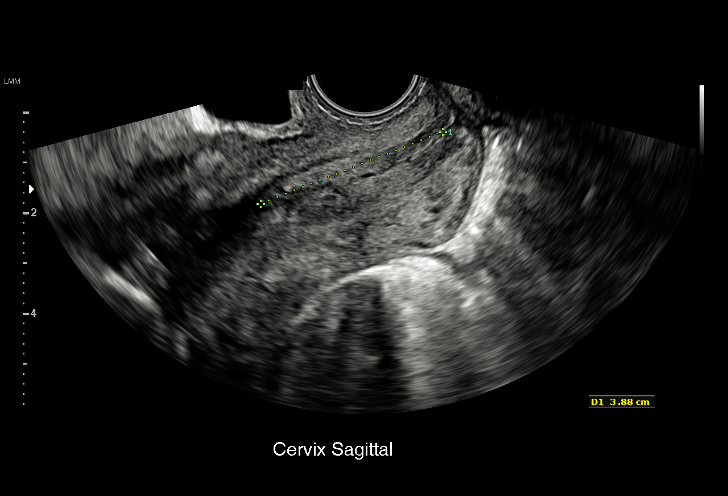
[im 12/17]
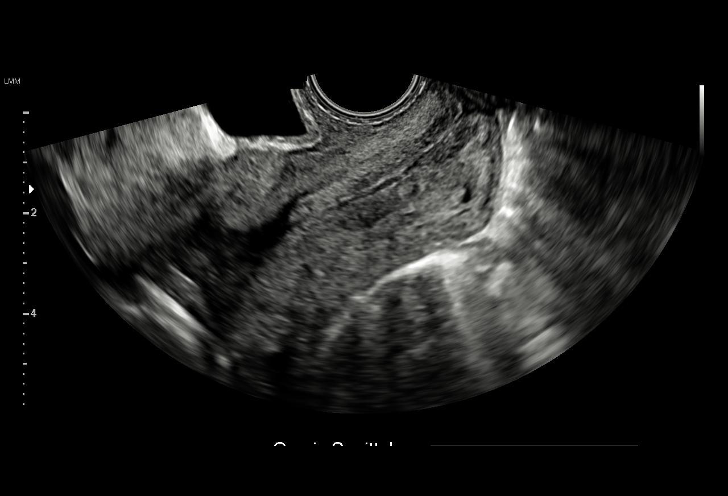
[im 14/17]
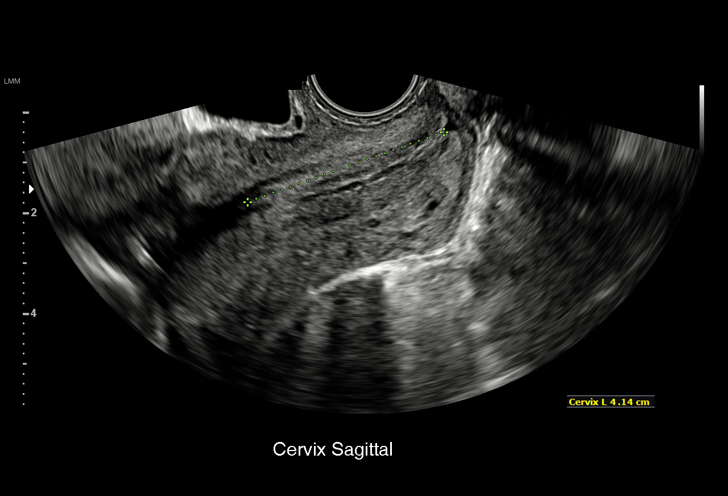
[im 15/17]
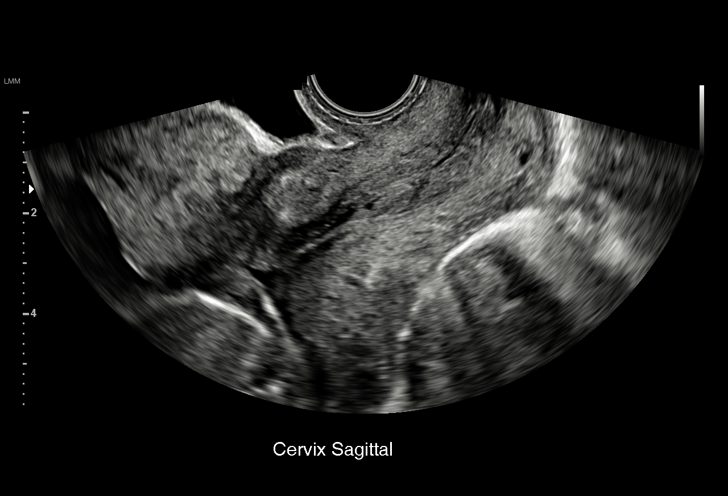
[im 16/17]
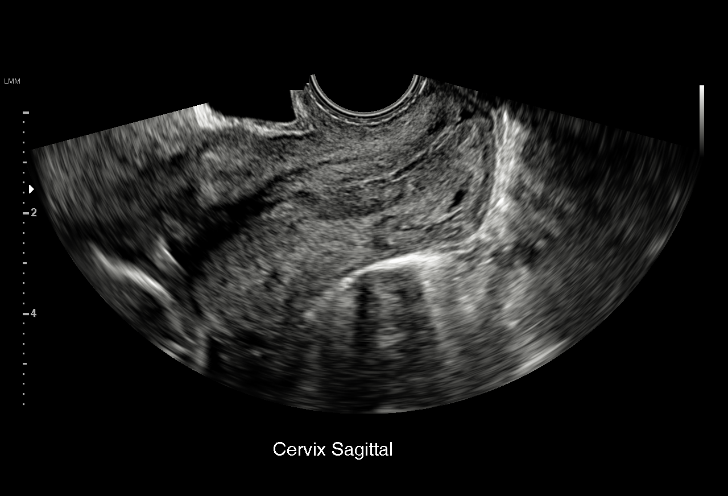
[im 17/17]
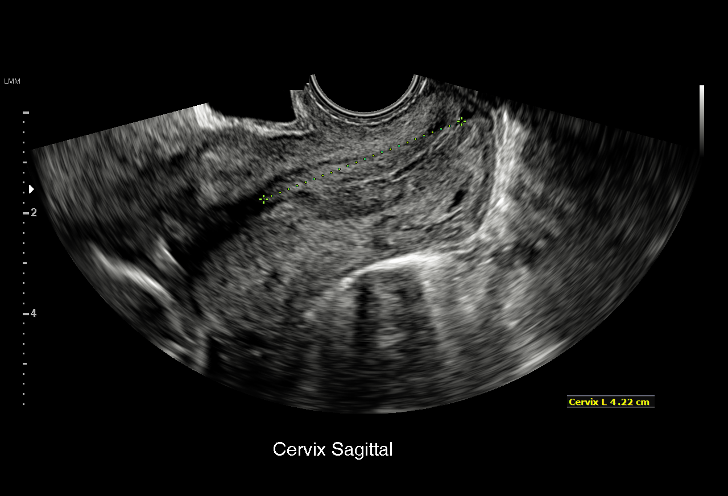

[15 of 17 positions shown; findings below may reference images not displayed]

Attending:        Kiko Tanis         Secondary Phy.:   [facility]-
MAU/Triage

1  RONISHA MATTA            [id number]      [phone]     [phone]
Indications

22 weeks gestation of pregnancy
Abdominal pain in pregnancy (cramping with
nausea and vomiting)
Poor obstetric history (prior pre-term labor);
previous pregnancy at 20 weeks, delivered
at 39 weeks
Poor obstetric history: Previous
preeclampsia / eclampsia/gestational HTN
(delivered at 34 weeks)
OB History

Gravidity:    3         Term:   1        Prem:   1
Living:       2
Fetal Evaluation

Num Of Fetuses:     1
Fetal Heart         129
Rate(bpm):
Cardiac Activity:   Observed
Presentation:       Transverse, head to maternal right

Amniotic Fluid
AFI FV:      Subjectively within normal limits
Gestational Age

LMP:           22w 0d        Date:  09/10/15                 EDD:   06/16/16
Best:          22w 0d     Det. By:  LMP  (09/10/15)          EDD:   06/16/16
Cervix Uterus Adnexa

Cervix
Length:           4.18  cm.
Normal appearance by transabdominal scan.

Cul De Sac:   No free fluid seen.
Comments

Read for Dr. Hakamo who was on call this weekend
Impression

Singleton intrauterine pregnancy at 22 weeks 0 days
gestation with fetal cardiac activity
Transverse presentation
Cervical length > 4.1 cm
Recommendations

Follow-up ultrasounds as clinically indicated.

## 2018-02-22 IMAGING — US US ABDOMEN LIMITED
1 series · 15 of 25 positions shown · non-contrast
Comparison: None.

CLINICAL DATA: Right upper quadrant pain. Thirty-one weeks
pregnant.

EXAM:
US ABDOMEN LIMITED - RIGHT UPPER QUADRANT

[Series 2: us abdomen limited · 15 of 45 slices shown]
[im 1/45]
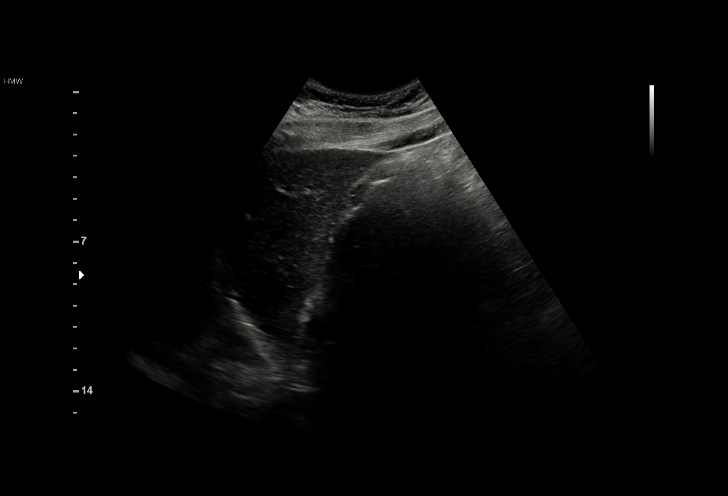
[im 4/45]
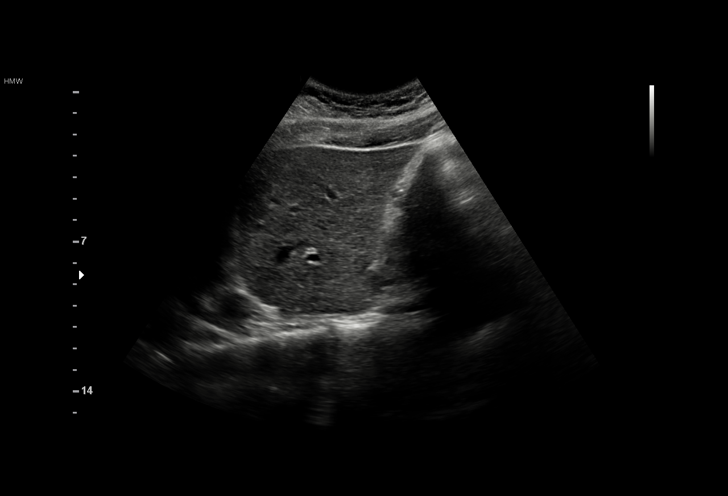
[im 8/45]
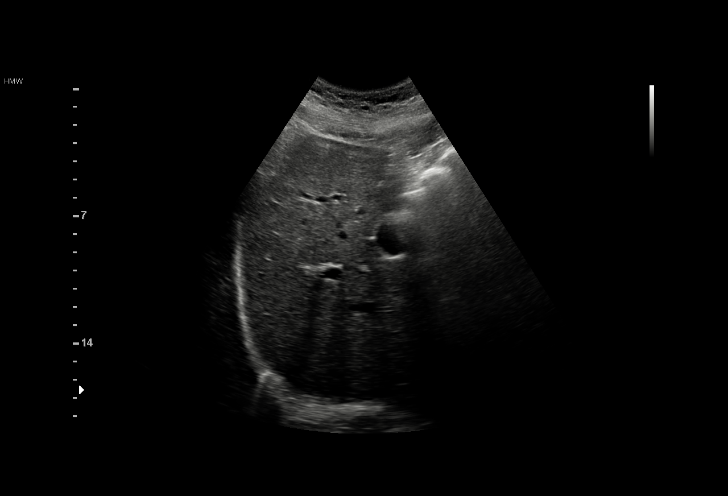
[im 10/45]
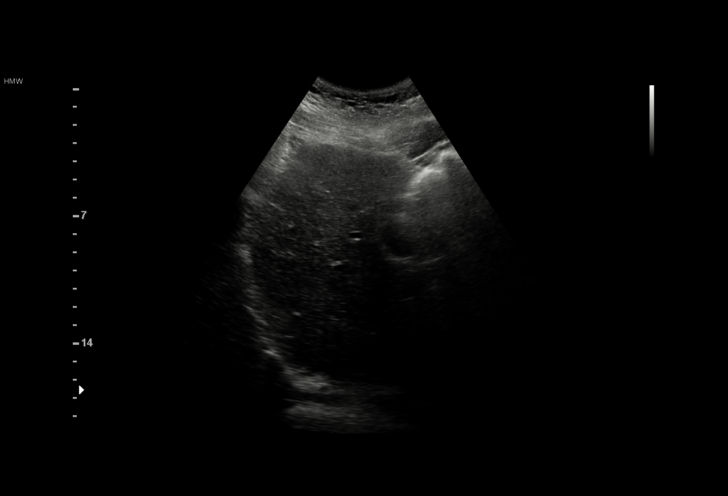
[im 13/45]
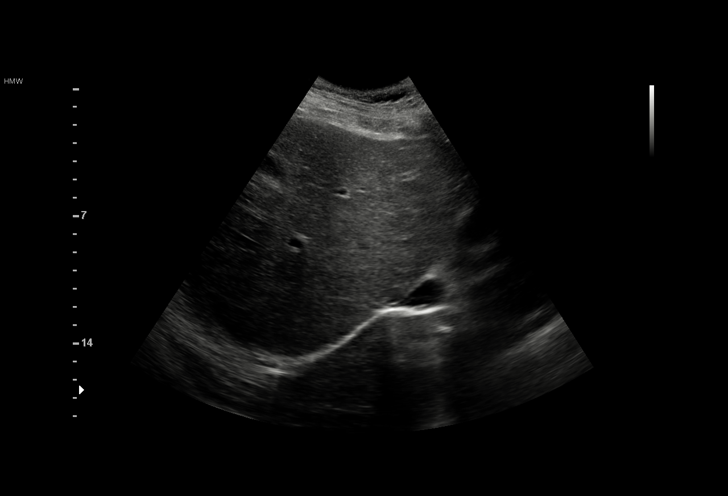
[im 17/45]
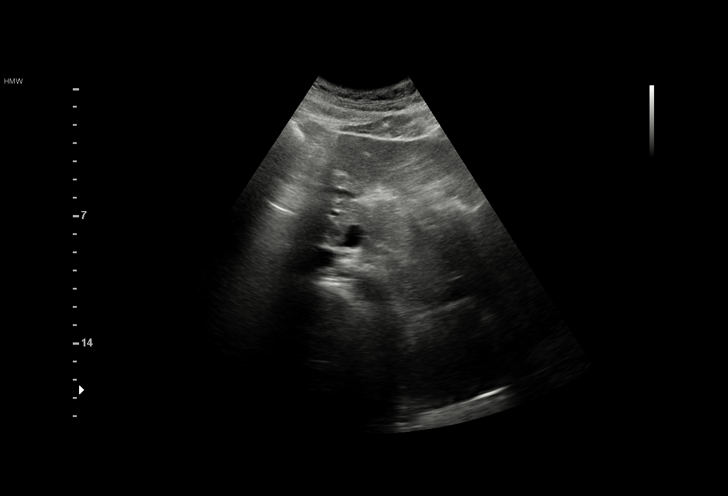
[im 19/45]
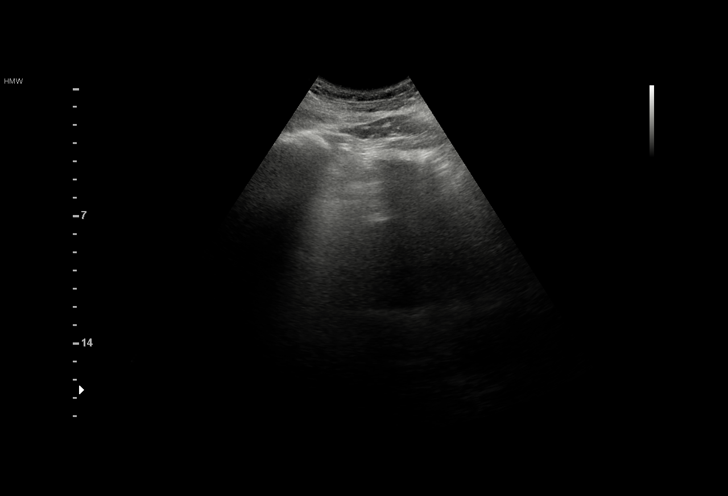
[im 23/45]
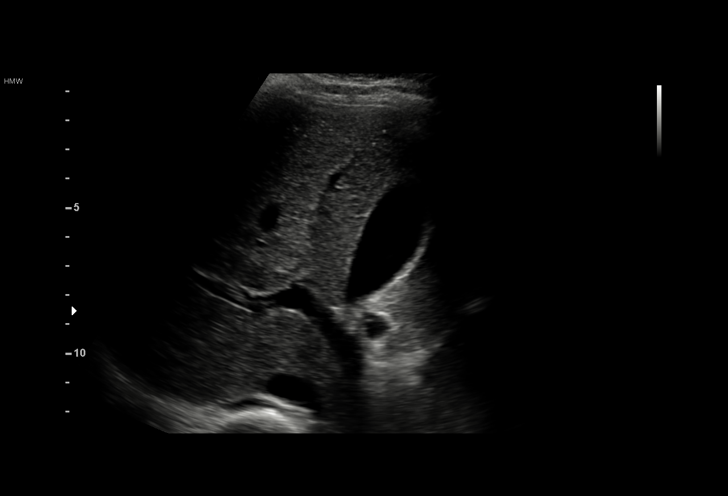
[im 26/45]
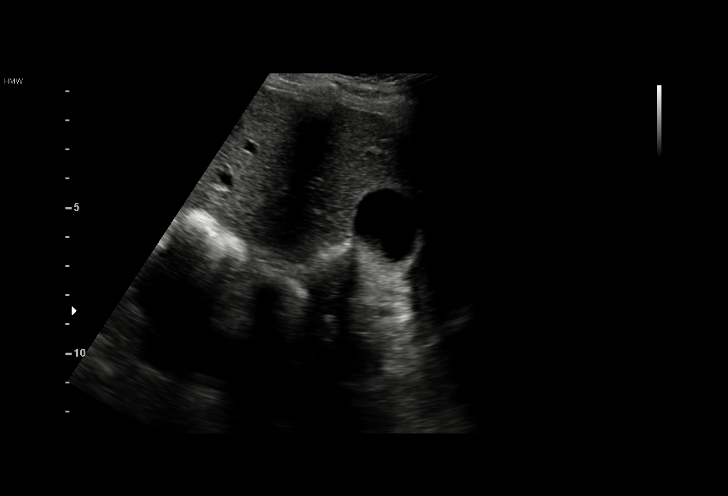
[im 28/45]
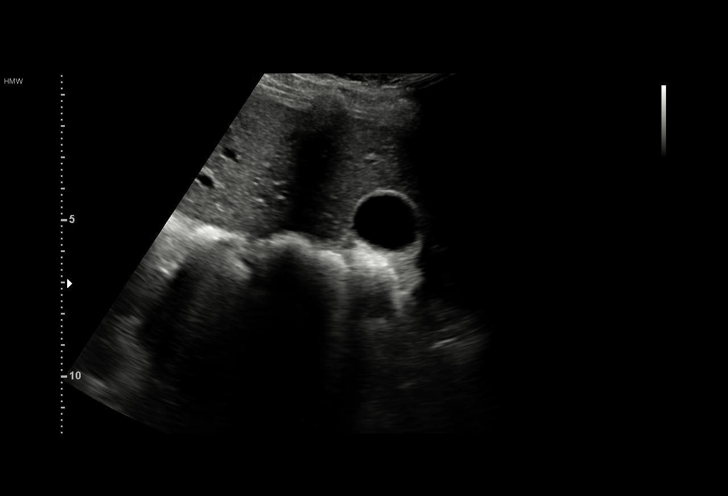
[im 32/45]
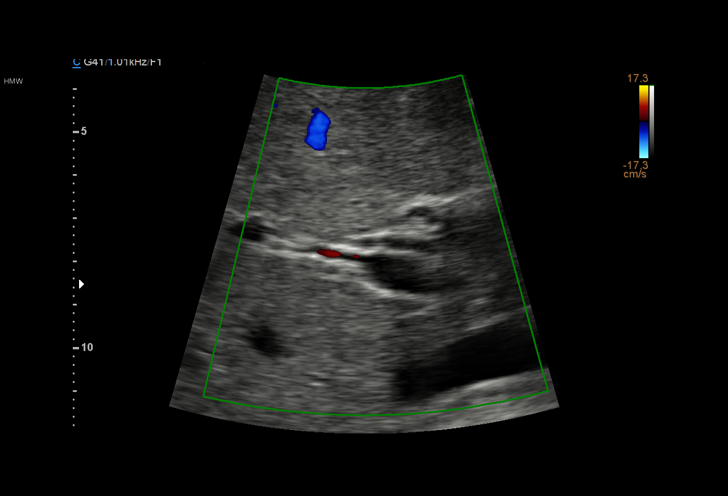
[im 35/45]
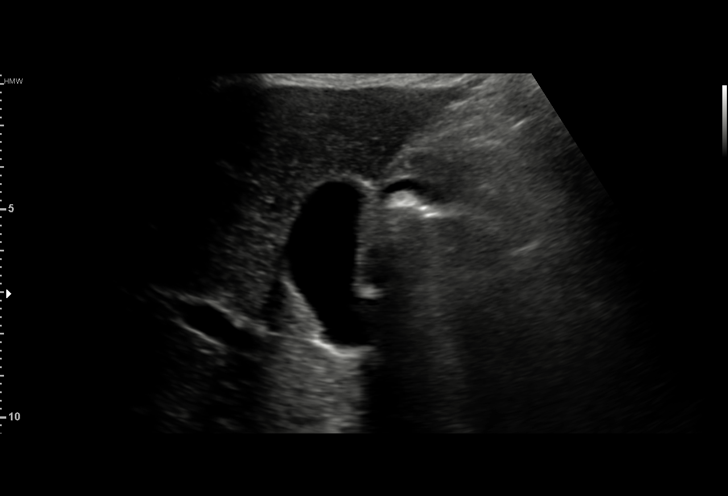
[im 37/45]
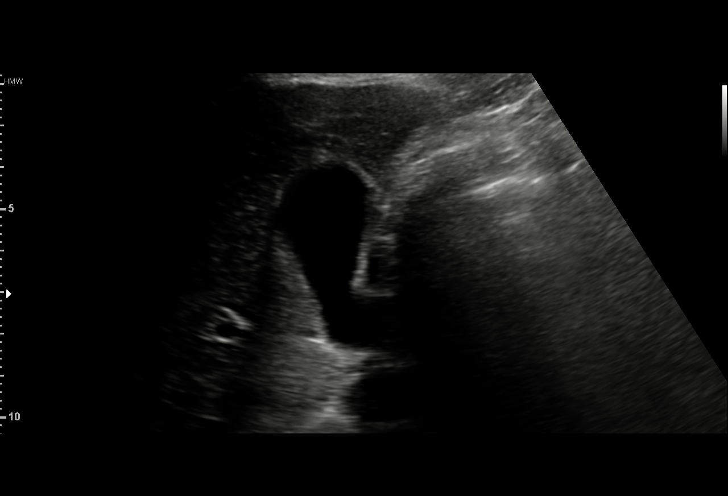
[im 41/45]
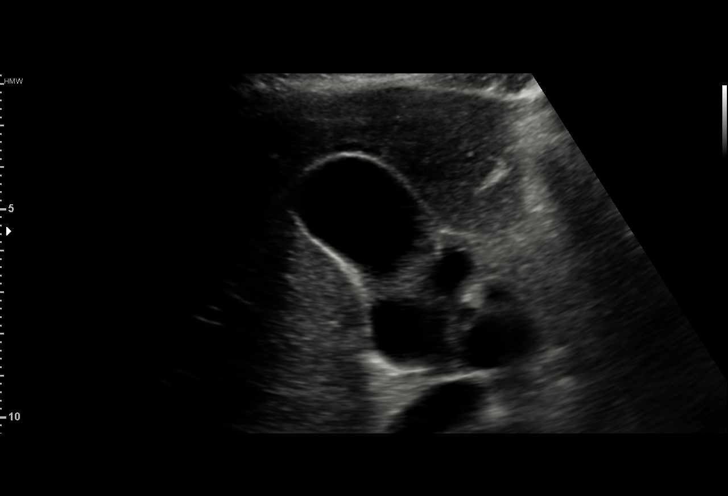
[im 45/45]
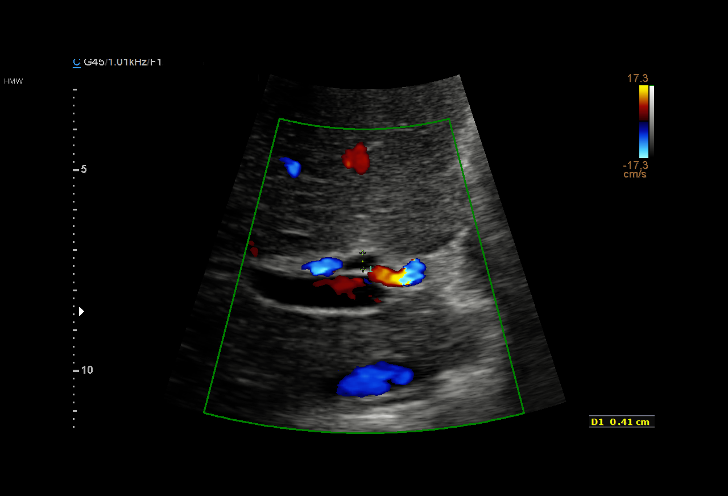

[15 of 25 positions shown; findings below may reference images not displayed]

FINDINGS: Gallbladder:

No gallstones or wall thickening visualized. No sonographic Murphy
sign noted by sonographer.

Common bile duct:

Diameter: 4.0 mm.

Liver:

No focal lesion identified. Within normal limits in parenchymal
echogenicity.
IMPRESSION: No acute hepatobiliary disease.

## 2018-06-21 IMAGING — US US TRANSVAGINAL NON-OB
1 series · 15 of 25 positions shown · non-contrast
Comparison: 02/11/2016 pelvic ultrasound.

CLINICAL DATA: 25 y/o F; vaginal bleeding and lower abdominal pain.



[Series 1: us transvaginal non-ob · 15 of 42 slices shown]
[im 1/42]
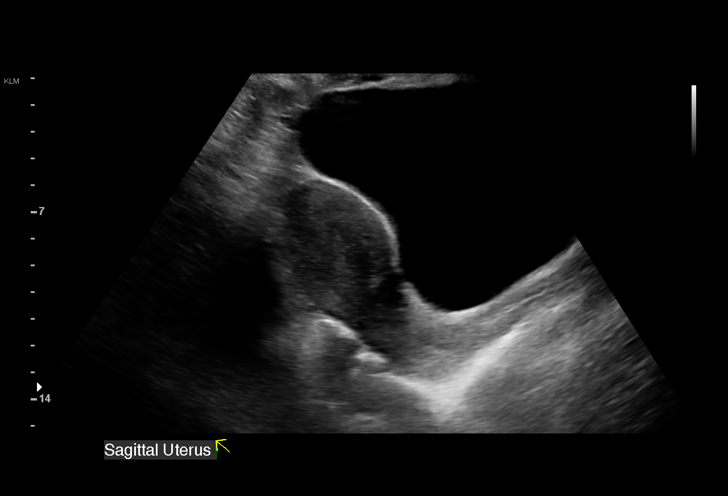
[im 4/42]
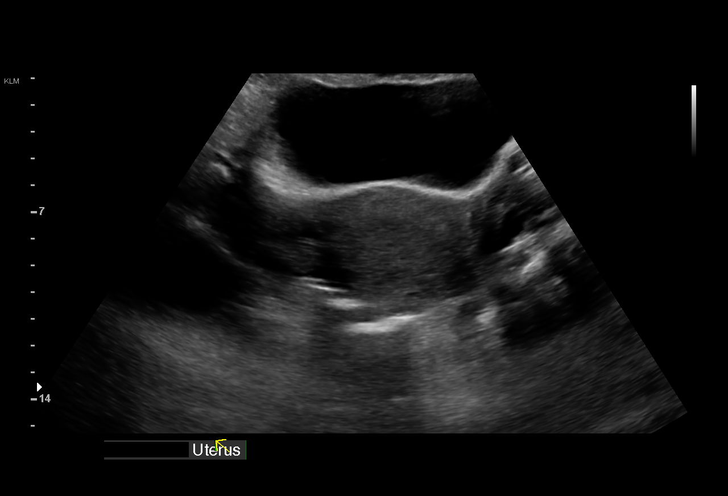
[im 7/42]
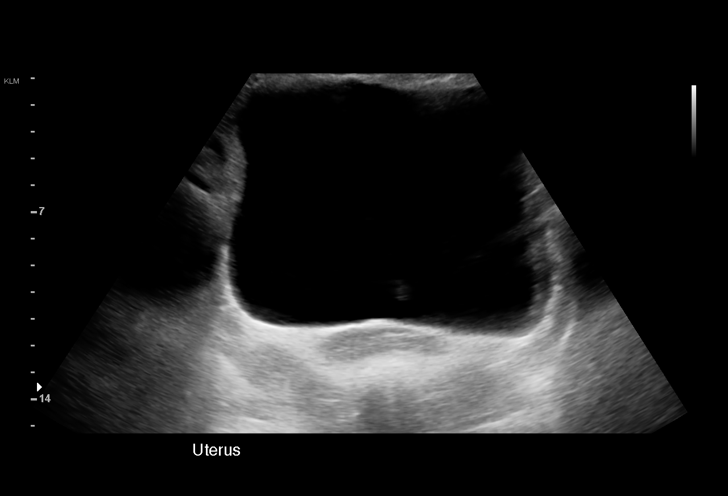
[im 9/42]
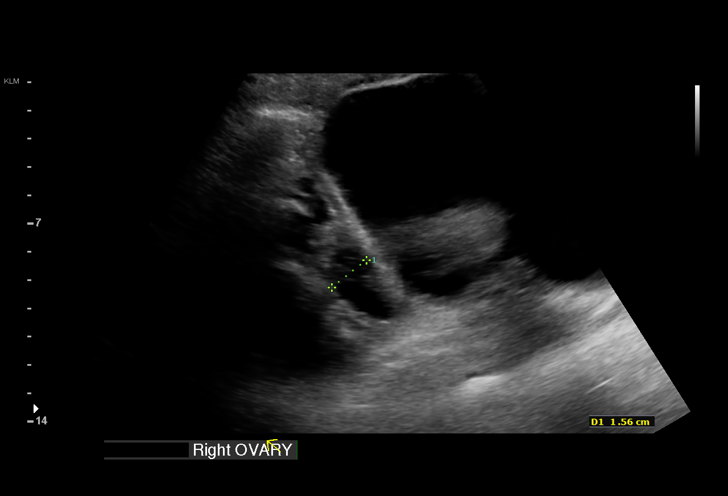
[im 12/42]
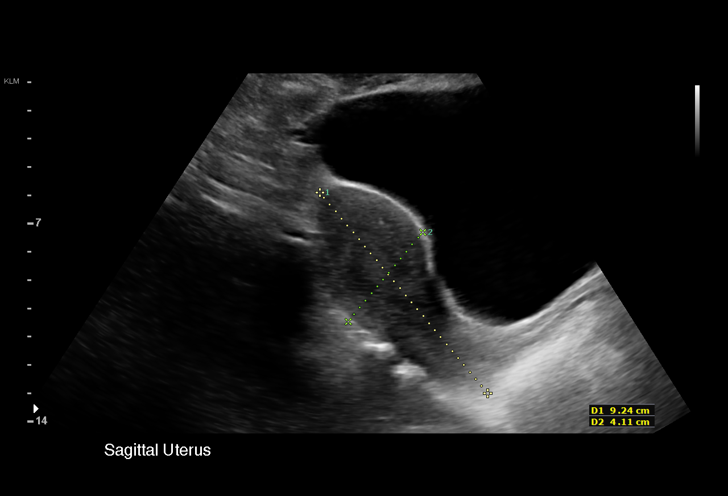
[im 16/42]
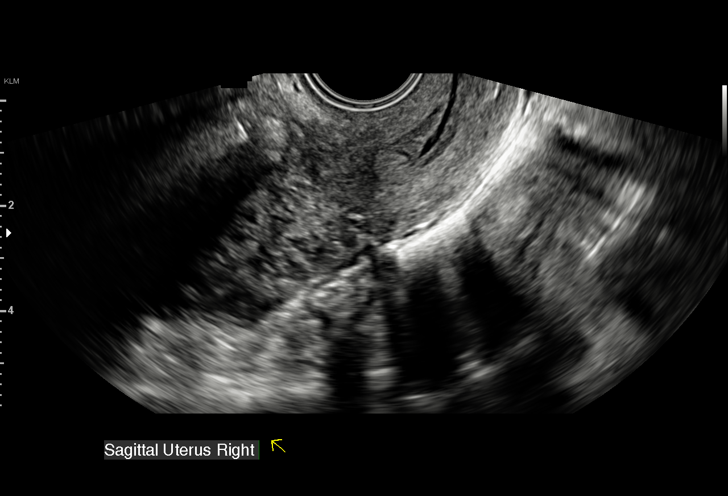
[im 18/42]
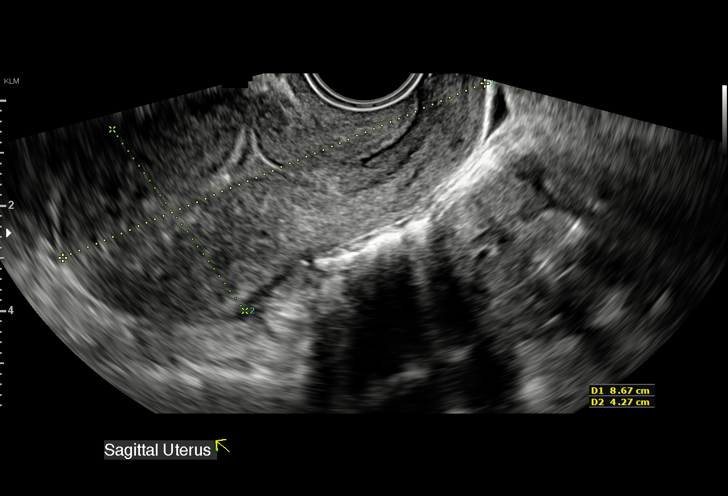
[im 21/42]
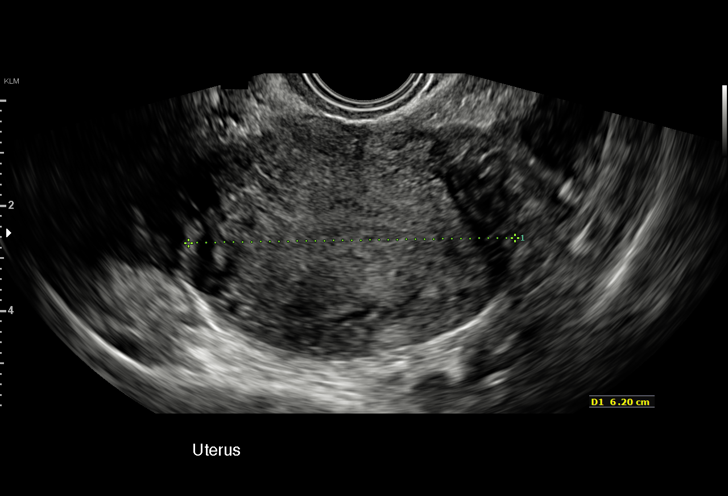
[im 24/42]
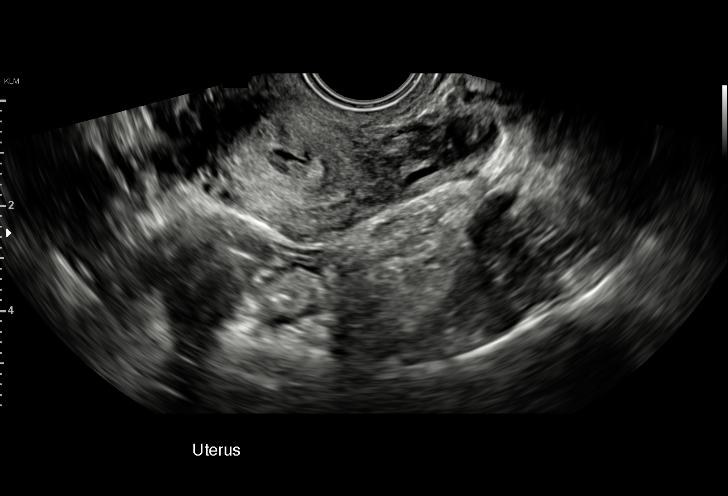
[im 26/42]
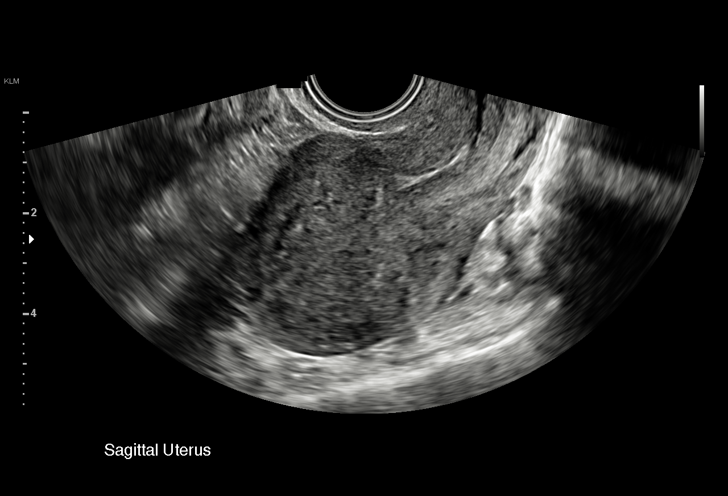
[im 30/42]
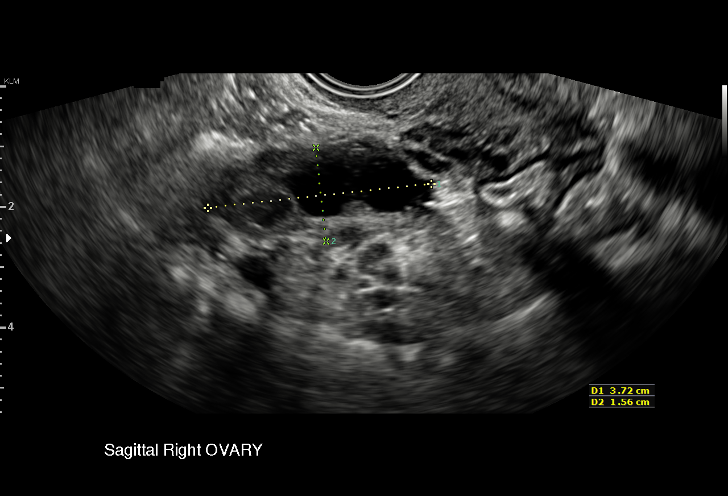
[im 33/42]
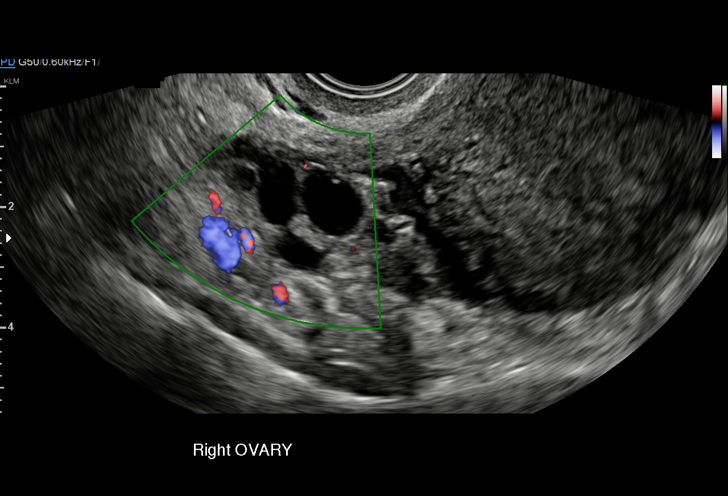
[im 35/42]
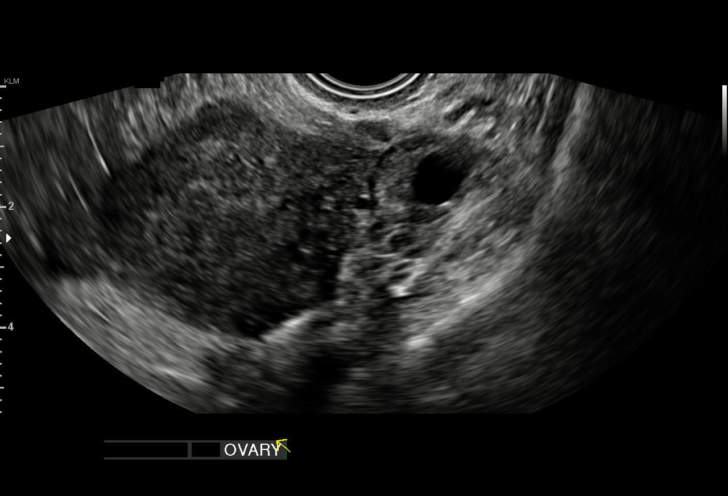
[im 38/42]
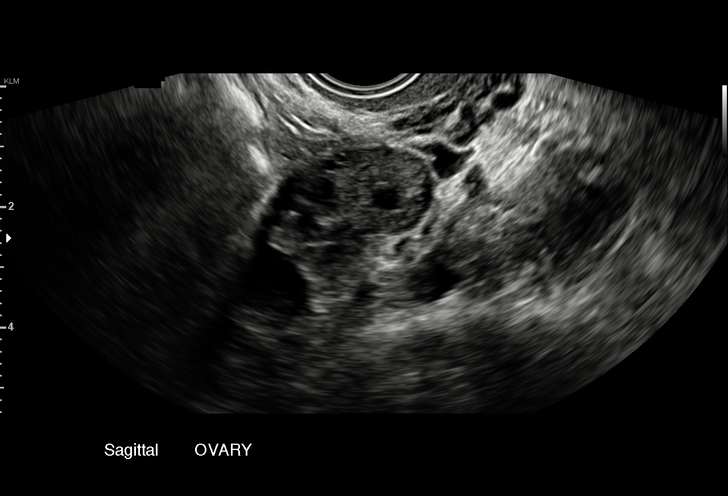
[im 42/42]
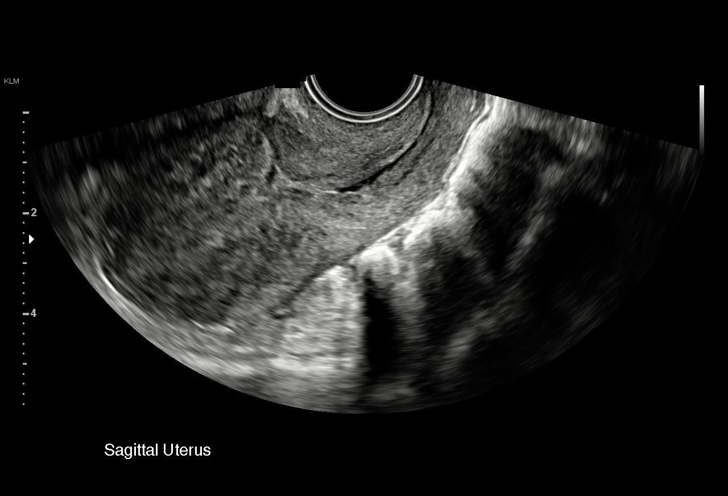

[15 of 25 positions shown; findings below may reference images not displayed]

FINDINGS: Uterus

Measurements: 8.7 x 4.3 x 6.2 cm.. No fibroids or other mass
visualized.

Endometrium

Thickness: 4 mm.. Trace fluid in the lower uterine segment. Anterior
cleft in the endometrial cavity of lower uterine segment probably
related to prior C-section.

Right ovary

Measurements: 3.7 x 1.6 x 2.0 cm. Normal appearance/no adnexal mass.

Left ovary

Measurements: 2.8 x 1.5 x 1.7 cm. Normal appearance/no adnexal mass.

Other findings

No abnormal free fluid.
IMPRESSION: 1. No acute process or mass identified.
2. Trace fluid in the lower uterine segment. No significant
endometrial thickening.

By: Tom-Arne Nadarasa M.D.
